# Patient Record
Sex: Male | Born: 1990 | Race: White | Hispanic: No | Marital: Single | Smoking: Current every day smoker
Health system: Southern US, Community
[De-identification: ages and names within clinical notes are randomized; demographics above are authoritative.]

## PROBLEM LIST (undated history)

## (undated) DIAGNOSIS — B1920 Unspecified viral hepatitis C without hepatic coma: Secondary | ICD-10-CM

## (undated) DIAGNOSIS — F909 Attention-deficit hyperactivity disorder, unspecified type: Secondary | ICD-10-CM

## (undated) DIAGNOSIS — F1110 Opioid abuse, uncomplicated: Secondary | ICD-10-CM

---
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
## 2012-11-06 ENCOUNTER — Encounter (HOSPITAL_COMMUNITY): Payer: Self-pay | Admitting: *Deleted

## 2012-11-06 ENCOUNTER — Emergency Department (HOSPITAL_COMMUNITY)
Admission: EM | Admit: 2012-11-06 | Discharge: 2012-11-07 | Discharge status: Against Medical Advice | Payer: Medicaid Other | Attending: Emergency Medicine | Admitting: Emergency Medicine

## 2012-11-06 DIAGNOSIS — Z79899 Other long term (current) drug therapy: Secondary | ICD-10-CM | POA: Insufficient documentation

## 2012-11-06 DIAGNOSIS — F1120 Opioid dependence, uncomplicated: Secondary | ICD-10-CM | POA: Insufficient documentation

## 2012-11-06 DIAGNOSIS — F17200 Nicotine dependence, unspecified, uncomplicated: Secondary | ICD-10-CM | POA: Insufficient documentation

## 2012-11-06 LAB — URINALYSIS, ROUTINE W REFLEX MICROSCOPIC
Bilirubin Urine: NEGATIVE
Ketones, ur: NEGATIVE mg/dL
Nitrite: NEGATIVE
pH: 6.5 (ref 5.0–8.0)

## 2012-11-06 LAB — COMPREHENSIVE METABOLIC PANEL
ALT: 15 U/L (ref 0–53)
AST: 19 U/L (ref 0–37)
Albumin: 3.9 g/dL (ref 3.5–5.2)
Calcium: 9.6 mg/dL (ref 8.4–10.5)
Sodium: 141 mEq/L (ref 135–145)
Total Protein: 7.6 g/dL (ref 6.0–8.3)

## 2012-11-06 LAB — RAPID URINE DRUG SCREEN, HOSP PERFORMED
Amphetamines: NOT DETECTED
Barbiturates: NOT DETECTED
Benzodiazepines: NOT DETECTED

## 2012-11-06 LAB — CBC WITH DIFFERENTIAL/PLATELET
Basophils Absolute: 0 10*3/uL (ref 0.0–0.1)
Eosinophils Absolute: 0.2 10*3/uL (ref 0.0–0.7)
Eosinophils Relative: 2 % (ref 0–5)
MCH: 30.8 pg (ref 26.0–34.0)
MCV: 87.4 fL (ref 78.0–100.0)
Platelets: 261 10*3/uL (ref 150–400)
RDW: 12.6 % (ref 11.5–15.5)
WBC: 7.2 10*3/uL (ref 4.0–10.5)

## 2012-11-06 LAB — ETHANOL: Alcohol, Ethyl (B): <11 mg/dL (ref 0–11)

## 2012-11-06 MED ORDER — LORAZEPAM 1 MG PO TABS: 1.0000 mg | ORAL_TABLET | Freq: Three times a day (TID) | ORAL | Status: DC | PRN

## 2012-11-06 MED ORDER — ONDANSETRON HCL 4 MG PO TABS: 4.0000 mg | ORAL_TABLET | Freq: Three times a day (TID) | ORAL | Status: DC | PRN

## 2012-11-06 MED ORDER — ALUM & MAG HYDROXIDE-SIMETH 200-200-20 MG/5ML PO SUSP: 30.0000 mL | ORAL | Status: DC | PRN

## 2012-11-06 MED ORDER — ACETAMINOPHEN 325 MG PO TABS: 650.0000 mg | ORAL_TABLET | ORAL | Status: DC | PRN

## 2012-11-06 MED ORDER — IBUPROFEN 600 MG PO TABS: 600.0000 mg | ORAL_TABLET | Freq: Three times a day (TID) | ORAL | Status: DC | PRN

## 2012-11-06 MED ORDER — ZOLPIDEM TARTRATE 5 MG PO TABS: 5.0000 mg | ORAL_TABLET | Freq: Every evening | ORAL | Status: DC | PRN

## 2012-11-06 NOTE — ED Provider Notes (Signed)
History     CSN: [id number]  Arrival date & time 11/06/12  2027   First MD Initiated Contact with Patient 11/06/12 2043      Chief Complaint  Patient presents with  . 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  Patient, states he does occasionally drink alcohol and smoke marijuana.  Patient denies suicidal or homicidal ideation.  Patient does not have any auditory or visual hallucinations.  Patient denies dizziness, chest pain, shortness of breath, visual changes, headache, nausea, vomiting, or diarrhea.  History reviewed. No pertinent past medical history.  History reviewed. No pertinent past surgical history.  History reviewed. No pertinent family history.  History  Substance Use Topics  . Smoking status: Current Every Day Smoker -- 1.0 packs/day    Types: Cigarettes  . Smokeless tobacco: Not on file  . Alcohol Use: Yes     Comment: "too upset to answer"      Review of Systems All other systems negative except as documented in the HPI. All pertinent positives and negatives as reviewed in the HPI.  Allergies  Cefzil  Home Medications   Current Outpatient Rx  Name  Route  Sig  Dispense  Refill  . DOXYCYCLINE HYCLATE 100 MG PO TABS   Oral   Take 100 mg by mouth daily.         Marland Kitchen LISDEXAMFETAMINE DIMESYLATE 70 MG PO CAPS   Oral   Take 70 mg by mouth daily.           BP 151/63  Pulse 91  Temp 99.1 F (37.3 C) (Oral)  Resp 21  SpO2 99%  Physical Exam  Nursing note and vitals reviewed. Constitutional: He is oriented to person, place, and time. He appears well-developed and well-nourished.  HENT:  Head: Normocephalic and atraumatic.  Mouth/Throat: Oropharynx is clear and  moist.  Eyes: Pupils are equal, round, and reactive to light.  Cardiovascular: Normal rate, regular rhythm and normal heart sounds.   Pulmonary/Chest: Effort normal and breath sounds normal. No respiratory distress.  Neurological: He is alert and oriented to person, place, and time.  Skin: Skin is warm and dry. No rash noted. No erythema. No pallor.  Psychiatric: He has a normal mood and affect. His speech is normal and behavior is normal. Judgment and thought content normal. Cognition and memory are normal.    ED Course  Procedures (including critical care time)  Labs Reviewed  URINE RAPID DRUG SCREEN (HOSP PERFORMED) - Abnormal; Notable for the following:    Opiates POSITIVE (*)     Tetrahydrocannabinol POSITIVE (*)     All other components within normal limits  CBC WITH DIFFERENTIAL  URINALYSIS, ROUTINE W REFLEX MICROSCOPIC  COMPREHENSIVE METABOLIC PANEL  ETHANOL   Patient will need evaluation by the ACT team   MDM          Carlyle Dolly, PA-C 11/06/12 2147

## 2012-11-06 NOTE — ED Notes (Signed)
Pt states he uses "drug"  Illegal and otherwise,  Also uses alcohol,  Pt request help to detox.  He is tearful during assesment

## 2012-11-06 NOTE — ED Provider Notes (Signed)
Medical screening examination/treatment/procedure(s) were performed by non-physician practitioner and as supervising physician I was immediately available for consultation/collaboration.   Akira Perusse, MD 11/06/12 2217 

## 2012-11-07 ENCOUNTER — Inpatient Hospital Stay (HOSPITAL_COMMUNITY): Admission: EM | Admit: 2012-11-07 | Payer: Medicaid Other | Source: Ambulatory Visit | Admitting: Psychiatry

## 2012-11-07 MED ORDER — CLONIDINE HCL 0.1 MG PO TABS: 0.1000 mg | ORAL_TABLET | Freq: Three times a day (TID) | ORAL | Status: DC | PRN

## 2012-11-07 NOTE — BH Assessment (Signed)
Assessment Note   [name] is a 21 y.o. 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  Pt denies SI/HI/Psych.  Pt says wants detox because--"I have a daughter, I don't want to live this way". Pt uses Heroin, 7-8 bags daily, last use was 11/06/12(used 4 bags today); THC, 2 grams daily, last used 11/06/12; Pain Pills(Oxycdodone, Percocet, Vicodin and Hydrcodone) 4-5 daily, says uses 2x's monthly, last use was 11/06/12.  Pt is intoxicated but is coherent enough to answer questions during assessment.  Pt has past inpt admissions with [facility] in 2011, 2009 and [place] (4 yrs ago) for SI/Depression.  Pt has current legal charges pending: Open Container, Felony Larceny and Failed Drug Test, tells this Clinical research associate that he is on probation for "Set up and deliver--Heroin".     Axis I: Polysub Dep  Axis II: Deferred Axis III: History reviewed. No pertinent past medical history. Axis IV: other psychosocial or environmental problems, problems related to legal system/crime, problems related to social environment and problems with primary support group Axis V: 51-60 moderate symptoms  Past Medical History: History reviewed. No pertinent past medical history.  History reviewed. No pertinent past surgical history.  Family History: History reviewed. No pertinent family history.  Social History:  reports that he has been smoking Cigarettes.  He has been smoking about 1 pack per day. He does not have any smokeless tobacco history on file. He reports that he drinks alcohol. He reports that he uses illicit drugs (Marijuana, Heroin, and Other-see comments).  Additional Social History:  Alcohol / Drug Use Pain Medications: None  Prescriptions: None  Over the Counter: None  History of alcohol / drug use?: Yes Substance #1 Name of Substance 1: Heroin--DOC 1 - Age of First Use: Teens  1 - Amount (size/oz): 7-8 Bags  1 - Frequency: Daily  1 - Duration: On-going  1 - Last  Use / Amount: 11/06/12 Substance #2 Name of Substance 2: THC  2 - Age of First Use: Teens  2 - Amount (size/oz): 2 Grams  2 - Frequency: Daily  2 - Duration: On-going  2 - Last Use / Amount: 11/06/12 Substance #3 Name of Substance 3: Pain Pills--Oxy, Percocet, Vicodin 3 - Age of First Use: Teens  3 - Amount (size/oz): 4-5 Pills  3 - Frequency: 2x's Monthly  3 - Duration: On-going  3 - Last Use / Amount: Unk  Substance #4 Name of Substance 4: Alcohol  4 - Age of First Use: Teens  4 - Amount (size/oz): 3-40's  4 - Frequency: Daily  4 - Duration: On-going  4 - Last Use / Amount: Unk   CIWA: CIWA-Ar BP: 149/65 mmHg Pulse Rate: 73  Nausea and Vomiting: no nausea and no vomiting Tactile Disturbances: none Tremor: no tremor Auditory Disturbances: not present Paroxysmal Sweats: no sweat visible Visual Disturbances: not present Anxiety: no anxiety, at ease Headache, Fullness in Head: none present Agitation: normal activity Orientation and Clouding of Sensorium: oriented and can do serial additions CIWA-Ar Total: 0  COWS: Clinical Opiate Withdrawal Scale (COWS) Resting Pulse Rate: Pulse Rate 80 or below Sweating: No report of chills or flushing Restlessness: Able to sit still Pupil Size: Pupils pinned or normal size for room light Bone or Joint Aches: Not present Runny Nose or Tearing: Not present GI Upset: No GI symptoms Tremor: No tremor Yawning: No yawning Anxiety or Irritability: None Gooseflesh Skin: Skin is smooth COWS Total Score: 0   Allergies:  Allergies  Allergen Reactions  . Cefzil (Cefprozil) Hives    Home Medications:  (Not in a hospital admission)  OB/GYN Status:  No LMP for male patient.  General Assessment Data [facility] of Assessment: [facility] Living Arrangements: Parent (Lives with mother ) Can pt return to current living arrangement?: Yes Admission Status: Voluntary Is patient capable of signing voluntary admission?: Yes Transfer from: Acute  Hospital Referral Source: MD  Education Status Is patient currently in school?: No Current Grade: None  Highest grade of school patient has completed: None  Name of school: None  Contact person: None   Risk to self Suicidal Ideation: No Suicidal Intent: No Is patient at risk for suicide?: No Suicidal Plan?: No Access to Means: No What has been your use of drugs/alcohol within the last 12 months?: Abusing: Heroin, Alcohol, THC, Pain Pills  Previous Attempts/Gestures: Yes (Thoughts only ) How many times?: 0  Other Self Harm Risks: None  Triggers for Past Attempts: Family contact Intentional Self Injurious Behavior: None Family Suicide History: No Recent stressful life event(s): Other (Comment) (Past hx SI thoughts; Chronic SA ) Persecutory voices/beliefs?: No Depression: Yes Depression Symptoms: Loss of interest in usual pleasures Substance abuse history and/or treatment for substance abuse?: Yes Suicide prevention information given to non-admitted patients: Not applicable  Risk to Others Homicidal Ideation: No Thoughts of Harm to Others: No Current Homicidal Intent: No Current Homicidal Plan: No Access to Homicidal Means: No Identified Victim: None  History of harm to others?: No Assessment of Violence: None Noted Violent Behavior Description: None  Does patient have access to weapons?: No Criminal Charges Pending?: Yes Describe Pending Criminal Charges: Open Container, Felony Larceny, Failed Drug test  Does patient have a court date: Yes Court Date:  (Unk )  Psychosis Hallucinations: None noted Delusions: None noted  Mental Status Report Appear/Hygiene: Disheveled Eye Contact: Good Motor Activity: Unremarkable Speech: Logical/coherent Level of Consciousness: Alert Mood: Other (Comment) (Appropriate ) Affect: Appropriate to circumstance Anxiety Level: None Thought Processes: Coherent;Relevant Judgement: Unimpaired Orientation:  Person;Place;Time;Situation Obsessive Compulsive Thoughts/Behaviors: None  Cognitive Functioning Concentration: Normal Memory: Recent Intact;Remote Intact IQ: Average Insight: Fair Impulse Control: Fair Appetite: Good Weight Loss: 0  Weight Gain: 0  Sleep: No Change Total Hours of Sleep: 8  Vegetative Symptoms: None  ADLScreening [facility]) Patient's cognitive ability adequate to safely complete daily activities?: Yes Patient able to express need for assistance with ADLs?: Yes Independently performs ADLs?: Yes (appropriate for developmental age)  Abuse/Neglect [facility]) Physical Abuse: Denies Verbal Abuse: Denies Sexual Abuse: Denies  Prior Inpatient Therapy Prior Inpatient Therapy: Yes Prior Therapy Dates: 2011, 2009 Prior Therapy Facilty/Provider(s): [facility]  Reason for Treatment: SI/Depression   Prior Outpatient Therapy Prior Outpatient Therapy: No Prior Therapy Dates: None  Prior Therapy Facilty/Provider(s): None  Reason for Treatment: None   ADL Screening (condition at time of admission) Patient's cognitive ability adequate to safely complete daily activities?: Yes Patient able to express need for assistance with ADLs?: Yes Independently performs ADLs?: Yes (appropriate for developmental age) Weakness of Legs: None Weakness of Arms/Hands: None  Home Assistive Devices/Equipment Home Assistive Devices/Equipment: None  Therapy Consults (therapy consults require a physician order) PT Evaluation Needed: No OT Evalulation Needed: No SLP Evaluation Needed: No Abuse/Neglect Assessment (Assessment to be complete while patient is alone) Physical Abuse: Denies Verbal Abuse: Denies Sexual Abuse: Denies Exploitation of patient/patient's resources: Denies Self-Neglect: Denies Values / Beliefs Cultural Requests During Hospitalization: None Spiritual Requests During Hospitalization: None Consults Spiritual Care Consult Needed: No  Social Work Consult Needed:  No Merchant navy officer (For Healthcare) Advance Directive: Patient does not have advance directive;Patient would not like information Pre-existing out of facility DNR order (yellow form or pink MOST form): No Nutrition Screen- [facility] Adult/[facility] Patient's home diet: Regular Have you recently lost weight without trying?: No Have you been eating poorly because of a decreased appetite?: No Malnutrition Screening Tool Score: 0   Additional Information 1:1 In Past 12 Months?: No CIRT Risk: No Elopement Risk: No Does patient have medical clearance?: Yes     Disposition:  Disposition Disposition of Patient: Inpatient treatment program;Referred to [facility] ) Type of inpatient treatment program: Adult Patient referred to: Other (Comment) [facility] )  On Site Evaluation by:   Reviewed with Physician:     Murrell Redden 11/07/2012 12:17 AM

## 2012-11-07 NOTE — ED Notes (Addendum)
Pt requested to leave AMA. Psych MD and Charge RN aware.

## 2012-11-07 NOTE — ED Provider Notes (Signed)
[name] is a 21 y.o. male who is here for withdrawal from opiates. He uses heroin and illicit narcotic tablets. Today, he "feels like, butt", he, states that this is a sensation of withdrawal from opiates.  Will add clonidine to his medications.  He has been accepted to the behavioral health hospital pending an open bed.  Flint Melter, MD 11/07/12 [phone]

## 2012-11-07 NOTE — BHH Counselor (Signed)
Pt accepted to [facility] by Donell Sievert, PA to Dr. Dub Mikes. Pending bed availability.

## 2013-02-09 ENCOUNTER — Encounter (HOSPITAL_BASED_OUTPATIENT_CLINIC_OR_DEPARTMENT_OTHER): Payer: Self-pay

## 2013-02-09 ENCOUNTER — Emergency Department (HOSPITAL_BASED_OUTPATIENT_CLINIC_OR_DEPARTMENT_OTHER)
Admission: EM | Admit: 2013-02-09 | Discharge: 2013-02-09 | Disposition: A | Discharge status: Home / Self Care | Payer: Medicaid Other | Attending: Emergency Medicine | Admitting: Emergency Medicine

## 2013-02-09 DIAGNOSIS — F1110 Opioid abuse, uncomplicated: Secondary | ICD-10-CM | POA: Insufficient documentation

## 2013-02-09 DIAGNOSIS — F17200 Nicotine dependence, unspecified, uncomplicated: Secondary | ICD-10-CM | POA: Insufficient documentation

## 2013-02-09 DIAGNOSIS — F1410 Cocaine abuse, uncomplicated: Secondary | ICD-10-CM | POA: Insufficient documentation

## 2013-02-09 DIAGNOSIS — B86 Scabies: Secondary | ICD-10-CM | POA: Insufficient documentation

## 2013-02-09 DIAGNOSIS — Z79899 Other long term (current) drug therapy: Secondary | ICD-10-CM | POA: Insufficient documentation

## 2013-02-09 HISTORY — DX: Opioid abuse, uncomplicated: F11.10

## 2013-02-09 MED ORDER — PERMETHRIN 5 % EX CREA: TOPICAL_CREAM | Freq: Once | CUTANEOUS | Status: DC

## 2013-02-09 NOTE — ED Provider Notes (Signed)
History     CSN: [id number]  Arrival date & time 02/09/13  0745   First MD Initiated Contact with Patient 02/09/13 [phone]      Chief Complaint  Patient presents with  . Rash    (Consider location/radiation/quality/duration/timing/severity/associated sxs/prior treatment) HPI Comments: 22 y.o PMH substance abuse (clean heroin 2 weeks, opiates, THC).  He presents after rash starting 12/20/12 while he was in a rehab facility in [place] at time of onset.  Rash is itching mostly at night.  Lives at home with mother (she is not itching).  He has a dog at home (pit bull) which is not itching (denies fleas being on the dog).  Nothing makes better tried Benadryl for itching which did not help.    SH: 1ppd, THC. Sexually active with intermittent use of condoms   Patient is a 22 y.o. male presenting with rash. The history is provided by the patient. No language interpreter was used.  Rash Location: thighs, hands, trunk. Quality: itchiness   Onset quality:  Gradual Duration:  1 month Progression:  Worsening Context: animal contact   Relieved by:  Antihistamines Worsened by:  Nothing tried Ineffective treatments:  Antihistamines Associated symptoms: no abdominal pain, no fever, no nausea, no shortness of breath and not vomiting     Past Medical History  Diagnosis Date  . Heroin abuse     History reviewed. No pertinent past surgical history.  History reviewed. No pertinent family history.  History  Substance Use Topics  . Smoking status: Current Every Day Smoker -- 1.00 packs/day for 5 years    Types: Cigarettes  . Smokeless tobacco: Never Used  . Alcohol Use: Yes     Comment: occasional use      Review of Systems  Constitutional: Negative for fever and chills.  Respiratory: Negative for shortness of breath.   Cardiovascular: Negative for chest pain.  Gastrointestinal: Negative for nausea, vomiting and abdominal pain.  Skin: Positive for rash.  All other systems reviewed  and are negative.    Allergies  Cefzil  Home Medications   Current Outpatient Rx  Name  Route  Sig  Dispense  Refill  . doxycycline (VIBRA-TABS) 100 MG tablet   Oral   Take 100 mg by mouth daily.         Marland Kitchen lisdexamfetamine (VYVANSE) 70 MG capsule   Oral   Take 70 mg by mouth daily.         . permethrin (ELIMITE) 5 % cream   Topical   Apply topically once.   60 g   1     BP 143/68  Pulse 70  Temp(Src) 97.8 F (36.6 C) (Oral)  Resp 17  Ht 6' (1.829 m)  Wt 165 lb (74.844 kg)  BMI 22.37 kg/m2  SpO2 99%  Physical Exam  Nursing note and vitals reviewed. Constitutional: He is oriented to person, place, and time. Vital signs are normal. He appears well-developed and well-nourished. He is cooperative. No distress.  HENT:  Head: Normocephalic and atraumatic.  Mouth/Throat: No oropharyngeal exudate.  Eyes: Conjunctivae are normal. Right eye exhibits no discharge. Left eye exhibits no discharge. No scleral icterus.  Cardiovascular: Normal rate, regular rhythm, S1 normal, S2 normal and normal heart sounds.   No murmur heard. Pulmonary/Chest: Effort normal and breath sounds normal. He has no wheezes.  Abdominal: Soft. Bowel sounds are normal. He exhibits no distension. There is no tenderness.  Musculoskeletal: He exhibits no edema.  Neurological: He is alert and oriented to person,  place, and time. Gait normal.  Skin: Skin is warm and dry. Rash noted. He is not diaphoretic.  Erythematous pruritic papules to finger webs, b/l legs (knees), trunk  Psychiatric: He has a normal mood and affect. His speech is normal and behavior is normal. Judgment and thought content normal. Cognition and memory are normal.    ED Course  Procedures (including critical care time)  Labs Reviewed - No data to display No results found.   1. Scabies       MDM  Elimite 5% once.  Repeat  X 1 if still itching.  Desma Maxim MD [phone]         Annett Gula, MD 02/09/13 [phone]

## 2013-02-09 NOTE — ED Provider Notes (Signed)
I saw and evaluated the patient, reviewed the resident's note and I agree with the findings and plan.  The patient presents with itchy rash for the past three weeks.  This started while in a rehab facility.  Otherwise no new contacts or exposures.  No fevers or chills.    On exam, the vitals are stable and the patient is afebrile.  There is a patchy, macular, pruritic rash to the hands, arms, and torso.  It is most pronounced in the webbing between the fingers.    This appears to be scabies and will be treated as such with permethrin.  Return or follow up prn.   Geoffery Lyons, MD 02/09/13 [phone]

## 2013-02-09 NOTE — ED Notes (Signed)
Pt states that he has had a rash generalized over body since January.  Pt states that this presented when he was at [facility] for substance abuse treatment.  Pt is now at [facility] for substance abuse tx.  Rash is small red rash, generalized over torso, extremities.

## 2013-02-10 ENCOUNTER — Encounter (HOSPITAL_BASED_OUTPATIENT_CLINIC_OR_DEPARTMENT_OTHER): Payer: Self-pay

## 2013-02-10 ENCOUNTER — Emergency Department (HOSPITAL_BASED_OUTPATIENT_CLINIC_OR_DEPARTMENT_OTHER)
Admission: EM | Admit: 2013-02-10 | Discharge: 2013-02-10 | Disposition: A | Discharge status: Home / Self Care | Payer: Medicaid Other | Attending: Emergency Medicine | Admitting: Emergency Medicine

## 2013-02-10 DIAGNOSIS — B86 Scabies: Secondary | ICD-10-CM | POA: Insufficient documentation

## 2013-02-10 DIAGNOSIS — F17200 Nicotine dependence, unspecified, uncomplicated: Secondary | ICD-10-CM | POA: Insufficient documentation

## 2013-02-10 DIAGNOSIS — F1210 Cannabis abuse, uncomplicated: Secondary | ICD-10-CM | POA: Insufficient documentation

## 2013-02-10 DIAGNOSIS — F1110 Opioid abuse, uncomplicated: Secondary | ICD-10-CM | POA: Insufficient documentation

## 2013-02-10 MED ORDER — PERMETHRIN 5 % EX CREA: TOPICAL_CREAM | CUTANEOUS | Status: DC

## 2013-02-10 NOTE — ED Notes (Signed)
Pt has had rash generalized over body since January 2nd, Pt was seen here on 2/22 and dx with scabies, pt is resident at [facility] and was told by staff there to use all the cream for the treatment at one time and now they want him to come back to ER for more treatment.  Rash improving some since 2/22 when seen by this RN.

## 2013-02-10 NOTE — ED Provider Notes (Signed)
History/physical exam/procedure(s) were performed by non-physician practitioner and as supervising physician I was immediately available for consultation/collaboration. I have reviewed all notes and am in agreement with care and plan.   Hilario Quarry, MD 02/10/13 [phone]

## 2013-02-10 NOTE — ED Provider Notes (Signed)
History     CSN: [id number]  Arrival date & time 02/10/13  1601   First MD Initiated Contact with Patient 02/10/13 1621      Chief Complaint  Patient presents with  . Scabies     (Consider location/radiation/quality/duration/timing/severity/associated sxs/prior treatment) HPI Comments: Patient is a 22 year old male who presents with a 2 month history of rash. The rash started gradually and progressively worsened since the onset. The rash is located on generalized body. Patient has tried Permethrin that was prescribed yesterday which has provided some relief. Patient denies new exposures to medications, soaps, lotions, detergent. Patient reports associated occasional itching. No aggravating/alleviating factors. Patient denies fever, chills, NVD, sore throat, oral lesions, ocular involvement, throat closing, wheezing, SOB, chest pain, abdominal pain. Patient is at [facility] and they want the patient to have more Permethrin.      Past Medical History  Diagnosis Date  . Heroin abuse     History reviewed. No pertinent past surgical history.  History reviewed. No pertinent family history.  History  Substance Use Topics  . Smoking status: Current Every Day Smoker -- 1.00 packs/day for 5 years    Types: Cigarettes  . Smokeless tobacco: Never Used  . Alcohol Use: Yes     Comment: occasional use      Review of Systems  Skin: Positive for rash.  All other systems reviewed and are negative.    Allergies  Cefzil  Home Medications   Current Outpatient Rx  Name  Route  Sig  Dispense  Refill  . doxycycline (VIBRA-TABS) 100 MG tablet   Oral   Take 100 mg by mouth daily.         Marland Kitchen lisdexamfetamine (VYVANSE) 70 MG capsule   Oral   Take 70 mg by mouth daily.         . permethrin (ELIMITE) 5 % cream   Topical   Apply topically once.   60 g   1     BP 139/82  Pulse 90  Temp(Src) 98.2 F (36.8 C) (Oral)  Resp 18  Ht 6' (1.829 m)  Wt 165 lb (74.844 kg)  BMI 22.37  kg/m2  SpO2 99%  Physical Exam  Nursing note and vitals reviewed. Constitutional: He appears well-developed and well-nourished. No distress.  HENT:  Head: Normocephalic and atraumatic.  Eyes: Conjunctivae are normal.  Neck: Normal range of motion.  Cardiovascular: Normal rate and regular rhythm.  Exam reveals no gallop and no friction rub.   No murmur heard. Pulmonary/Chest: Effort normal and breath sounds normal. He has no wheezes. He has no rales. He exhibits no tenderness.  Abdominal: Soft. There is no tenderness.  Musculoskeletal: Normal range of motion.  Neurological: He is alert.  Speech is goal-oriented. Moves limbs without ataxia.   Skin: Skin is warm and dry.  Scattered papules over generalized body and finger webs.   Psychiatric: He has a normal mood and affect. His behavior is normal.    ED Course  Procedures (including critical care time)  Labs Reviewed - No data to display No results found.   1. Scabies       MDM  4:35 PM Patient will have another prescription for Permethrin per [facility] request. No further evaluation needed at this time.       Emilia Beck, [place] 02/10/13 1639

## 2013-02-20 NOTE — ED Notes (Signed)
Pt came to lobby requesting copy of dc papers from previous visit.Marland KitchenMarland KitchenAn After Visit Summary was printed and given to the patient. Provided.

## 2013-04-11 ENCOUNTER — Other Ambulatory Visit: Payer: Self-pay

## 2013-04-11 ENCOUNTER — Encounter (HOSPITAL_COMMUNITY): Payer: Self-pay | Admitting: Emergency Medicine

## 2013-04-11 ENCOUNTER — Inpatient Hospital Stay (HOSPITAL_COMMUNITY)
Admission: EM | Admit: 2013-04-11 | Discharge: 2013-04-12 | DRG: 918 | Disposition: A | Discharge status: Home / Self Care | Payer: Medicaid Other | Attending: Family Medicine | Admitting: Family Medicine

## 2013-04-11 DIAGNOSIS — F1210 Cannabis abuse, uncomplicated: Secondary | ICD-10-CM | POA: Diagnosis present

## 2013-04-11 DIAGNOSIS — F1110 Opioid abuse, uncomplicated: Secondary | ICD-10-CM | POA: Diagnosis present

## 2013-04-11 DIAGNOSIS — D72829 Elevated white blood cell count, unspecified: Secondary | ICD-10-CM | POA: Diagnosis present

## 2013-04-11 DIAGNOSIS — I498 Other specified cardiac arrhythmias: Secondary | ICD-10-CM | POA: Diagnosis present

## 2013-04-11 DIAGNOSIS — T401X4A Poisoning by heroin, undetermined, initial encounter: Principal | ICD-10-CM | POA: Diagnosis present

## 2013-04-11 DIAGNOSIS — F17200 Nicotine dependence, unspecified, uncomplicated: Secondary | ICD-10-CM | POA: Diagnosis present

## 2013-04-11 DIAGNOSIS — B1920 Unspecified viral hepatitis C without hepatic coma: Secondary | ICD-10-CM | POA: Diagnosis present

## 2013-04-11 DIAGNOSIS — T401X1A Poisoning by heroin, accidental (unintentional), initial encounter: Secondary | ICD-10-CM

## 2013-04-11 DIAGNOSIS — R768 Other specified abnormal immunological findings in serum: Secondary | ICD-10-CM | POA: Diagnosis present

## 2013-04-11 DIAGNOSIS — T50901A Poisoning by unspecified drugs, medicaments and biological substances, accidental (unintentional), initial encounter: Secondary | ICD-10-CM

## 2013-04-11 HISTORY — DX: Attention-deficit hyperactivity disorder, unspecified type: F90.9

## 2013-04-11 LAB — COMPREHENSIVE METABOLIC PANEL
Albumin: 3.9 g/dL (ref 3.5–5.2)
BUN: 17 mg/dL (ref 6–23)
Calcium: 8.9 mg/dL (ref 8.4–10.5)
Chloride: 101 mEq/L (ref 96–112)
Creatinine, Ser: 1.12 mg/dL (ref 0.50–1.35)
Total Bilirubin: 0.3 mg/dL (ref 0.3–1.2)

## 2013-04-11 LAB — CBC
HCT: 42.6 % (ref 39.0–52.0)
MCH: 30.8 pg (ref 26.0–34.0)
MCHC: 35.2 g/dL (ref 30.0–36.0)
MCV: 87.5 fL (ref 78.0–100.0)
Platelets: 286 10*3/uL (ref 150–400)
RDW: 12.9 % (ref 11.5–15.5)
WBC: 19.5 10*3/uL — ABNORMAL HIGH (ref 4.0–10.5)

## 2013-04-11 LAB — SALICYLATE LEVEL: Salicylate Lvl: <2 mg/dL — ABNORMAL LOW (ref 2.8–20.0)

## 2013-04-11 LAB — RAPID URINE DRUG SCREEN, HOSP PERFORMED
Barbiturates: NOT DETECTED
Cocaine: NOT DETECTED

## 2013-04-11 LAB — GLUCOSE, CAPILLARY: Glucose-Capillary: 139 mg/dL — ABNORMAL HIGH (ref 70–99)

## 2013-04-11 LAB — ACETAMINOPHEN LEVEL: Acetaminophen (Tylenol), Serum: <15 ug/mL (ref 10–30)

## 2013-04-11 LAB — ETHANOL: Alcohol, Ethyl (B): <11 mg/dL (ref 0–11)

## 2013-04-11 MED ORDER — SODIUM CHLORIDE 0.9 % IV SOLN
INTRAVENOUS | Status: AC
  Administered 2013-04-12: 02:00:00 via INTRAVENOUS

## 2013-04-11 MED ORDER — SODIUM CHLORIDE 0.9 % IV BOLUS (SEPSIS)
1000.0000 mL | Freq: Once | INTRAVENOUS | Status: AC
  Administered 2013-04-11: 1000 mL via INTRAVENOUS

## 2013-04-11 NOTE — ED Provider Notes (Signed)
History     CSN: [id number]  Arrival date & time 04/11/13  1805   First MD Initiated Contact with Patient 04/11/13 1826      Chief Complaint  Patient presents with  . Drug Overdose    Heroin    (Consider location/radiation/quality/duration/timing/severity/associated sxs/prior treatment) HPI Pt brought in by EMS after being found unresponsive and apneic outdoors. Per EMS pt was cyanotic at scene. Given 1 mg of Narcan with return to normal levels of alertness and supporting airway. Pt admits to "shooting up" heroin but says that he rarely uses. Denied other drugs or alcohol. Denied SI. Pt denies HA, neck pain, CP, SOB abd pain, N/V/D. No trauma.  Past Medical History  Diagnosis Date  . Heroin abuse     History reviewed. No pertinent past surgical history.  No family history on file.  History  Substance Use Topics  . Smoking status: Current Every Day Smoker -- 1.00 packs/day for 5 years    Types: Cigarettes  . Smokeless tobacco: Never Used  . Alcohol Use: Yes     Comment: occasional use      Review of Systems  Constitutional: Negative for fever and chills.  HENT: Negative for facial swelling and neck pain.   Respiratory: Negative for shortness of breath.   Cardiovascular: Negative for chest pain.  Gastrointestinal: Negative for nausea, vomiting, abdominal pain and diarrhea.  Musculoskeletal: Negative for myalgias and back pain.  Skin: Negative for rash and wound.  Neurological: Positive for syncope. Negative for dizziness, weakness, light-headedness, numbness and headaches.  Psychiatric/Behavioral: Negative for suicidal ideas.  All other systems reviewed and are negative.    Allergies  Cefzil  Home Medications   Current Outpatient Rx  Name  Route  Sig  Dispense  Refill  . doxycycline (VIBRA-TABS) 100 MG tablet   Oral   Take 100 mg by mouth daily.         Marland Kitchen lisdexamfetamine (VYVANSE) 70 MG capsule   Oral   Take 70 mg by mouth daily.           BP  118/57  Pulse 78  Temp(Src) 98 F (36.7 C) (Oral)  Resp 17  SpO2 100%  Physical Exam  Nursing note and vitals reviewed. Constitutional: He is oriented to person, place, and time. He appears well-developed and well-nourished. No distress.  HENT:  Head: Normocephalic and atraumatic.  Mouth/Throat: Oropharynx is clear and moist.  Dried lips  Eyes: EOM are normal. Pupils are equal, round, and reactive to light.  bl pinpoint pupils  Neck: Normal range of motion. Neck supple.  No posterior cervical tenderness or evidence of trauma  Cardiovascular: Regular rhythm.   tachycardia  Pulmonary/Chest: Effort normal and breath sounds normal. No respiratory distress. He has no wheezes. He has no rales. He exhibits no tenderness.  Abdominal: Soft. Bowel sounds are normal. He exhibits no distension and no mass. There is no tenderness. There is no rebound and no guarding.  Musculoskeletal: Normal range of motion. He exhibits no edema and no tenderness.  Neurological: He is alert and oriented to person, place, and time.  5/5 motor in all ext, sensation intact  Skin: Skin is warm and dry. No rash noted. No erythema.  Psychiatric: He has a normal mood and affect. His behavior is normal.    ED Course  Procedures (including critical care time)  Labs Reviewed  CBC - Abnormal; Notable for the following:    WBC 19.5 (*)    All other components within normal  limits  COMPREHENSIVE METABOLIC PANEL - Abnormal; Notable for the following:    Glucose, Bld 117 (*)    AST 106 (*)    ALT 269 (*)    All other components within normal limits  SALICYLATE LEVEL - Abnormal; Notable for the following:    Salicylate Lvl <2.0 (*)    All other components within normal limits  GLUCOSE, CAPILLARY - Abnormal; Notable for the following:    Glucose-Capillary 139 (*)    All other components within normal limits  ETHANOL  ACETAMINOPHEN LEVEL  TROPONIN I  URINE RAPID DRUG SCREEN (HOSP PERFORMED)   No results  found.   1. Drug overdose, initial encounter      Date: 04/11/2013  Rate:105  Rhythm: sinus tachycardia  QRS Axis: normal  Intervals: normal  ST/T Wave abnormalities: ST depressions inferiorly  Conduction Disutrbances:nonspecific intraventricular conduction delay  Narrative Interpretation:   Old EKG Reviewed: none available    MDM   [facility] to admit for obs. Cardiology reviewed EKG. Suggest repeat in AM. No immediate intervention needed.        Loren Racer, MD 04/11/13 [phone]

## 2013-04-11 NOTE — ED Notes (Signed)
Apple juice and ham sandwich give to pt

## 2013-04-11 NOTE — ED Notes (Signed)
Per EMS pt was found in a car behind cookout on [address] with his friend where they both OD on heroin.  A bystander found them both on responsive.  Per EMS pt was apneic when they arrived on scene and pt was cyanotic.  EMS admin 1mg  Narcan at scene as well as had to assisted with respirations.  Pt now A&Ox4, Pt has 20g in left forearm, CBG 219, 128/84, pt showing sius tach per EMS HR112, 97% 2L O2.

## 2013-04-11 NOTE — ED Notes (Signed)
[id number]<AV> Expected date:<BR> Expected time:<BR> Means of arrival:Ambulance<BR> Comments:<BR> Heroin OD

## 2013-04-11 NOTE — ED Notes (Signed)
Pt has attempted to urinate x's 3. Pt has had 2 pitchers of water and 1.5L of fluid. Dr. Ranae Palms aware.

## 2013-04-11 NOTE — ED Notes (Signed)
Pt unable to urinate.

## 2013-04-11 NOTE — H&P (Addendum)
[facility] History and Physical  [name]:[id number] DOB: 29-Oct-1991 DOA: 04/11/2013  Referring physician: ED physician PCP: Kaleen Mask, MD   Chief Complaint: heroin overdose  HPI:  22 year old male with no significant  past medical history who was brought by EMS to [facility] ED 04/11/2013 after he was found unresponsive in a car after heroin use. In field, patient was given narcan 1 dose and became more alert. Patient reports no chest pain, no shortness of breath, no palpitations. No abdominal pain, no nausea or vomiting. No diarrhea or constipation. No fever or chills. No blood in stool or urine. In ED, patient was more alert, BP 118/57 and HR 76 - 121. EKG 12 lead showed sinus tachycardia. His CBC revealed WBC count of 19.5.  Assessment and Plan:  Principal Problem:   Heroin overdose - patient more alert after narcan - monitor on telemetry - provide supportive care, IV fluids, antiemetics, analgesics if needed Active Problems:   Leukocytosis - unclear etiology; likely reactive - follow up urinalysis results.   Transaminitis - unclear etiology  - will check hepatitis panel  Code Status: Full Family Communication: no family at the bedside Disposition Plan: admit to telemetry  [facility] B[facility] [phone]  Review of Systems:  Constitutional: Negative for fever, chills and malaise/fatigue. Negative for diaphoresis.  HENT: Negative for hearing loss, ear pain, nosebleeds, congestion, sore throat, neck pain, tinnitus and ear discharge.   Eyes: Negative for blurred vision, double vision, photophobia, pain, discharge and redness.  Respiratory: Negative for cough, hemoptysis, sputum production, shortness of breath, wheezing and stridor.   Cardiovascular: Negative for chest pain, palpitations, orthopnea, claudication and leg swelling.  Gastrointestinal: Negative for nausea, vomiting and abdominal pain. Negative for heartburn, constipation, blood in stool and  melena.  Genitourinary: Negative for dysuria, urgency, frequency, hematuria and flank pain.  Musculoskeletal: Negative for myalgias, back pain, joint pain and falls.  Skin: Negative for itching and rash.  Neurological: Negative for dizziness and weakness. Negative for tingling, tremors, sensory change, speech change, focal weakness, loss of consciousness and headaches.  Endo/Heme/Allergies: Negative for environmental allergies and polydipsia. Does not bruise/bleed easily.  Psychiatric/Behavioral: Negative for suicidal ideas. The patient is not nervous/anxious.      Past Medical History  Diagnosis Date  . Heroin abuse     History reviewed. No pertinent past surgical history.  Social History:  reports that he has been smoking Cigarettes.  He has a 5 pack-year smoking history. He has never used smokeless tobacco. He reports that  drinks alcohol. He reports that he uses illicit drugs (Marijuana, Heroin, and Other-see comments).  Allergies  Allergen Reactions  . Cefzil (Cefprozil) Hives    Family history: htn in mother  Prior to Admission medications   Medication Sig Start Date End Date Taking? Authorizing Provider  doxycycline (VIBRA-TABS) 100 MG tablet Take 100 mg by mouth daily.   Yes Historical Provider, MD  lisdexamfetamine (VYVANSE) 70 MG capsule Take 70 mg by mouth daily.   Yes Historical Provider, MD    Physical Exam: Filed Vitals:   04/11/13 2030 04/11/13 2200 04/11/13 2230 04/11/13 2250  BP: 134/86 119/76 118/57 118/57  Pulse:  78 76 78  Temp:      TempSrc:      Resp:  15 12 17   SpO2:  100% 100% 100%    Physical Exam  Constitutional: Appears well-developed and well-nourished. No distress.  HENT: Normocephalic. External right and left ear normal. Oropharynx is clear and moist.  Eyes:  Conjunctivae and EOM are normal. PERRLA, no scleral icterus.  Neck: Normal ROM. Neck supple. No JVD. No tracheal deviation. No thyromegaly.  CVS: regular rhythm, tachycardic, no murmur   Pulmonary: Effort and breath sounds normal, no stridor, rhonchi, wheezes, rales.  Abdominal: Soft. BS +,  no distension, tenderness, rebound or guarding.  Musculoskeletal: Normal range of motion. No edema and no tenderness.  Lymphadenopathy: No lymphadenopathy noted, cervical, inguinal. Neuro: Alert. Normal reflexes, muscle tone coordination. No cranial nerve deficit. Skin: Skin is warm and dry. No rash noted. Not diaphoretic. No erythema. No pallor.  Psychiatric: Normal mood and affect. Behavior, judgment, thought content normal.   Labs on Admission:  Basic Metabolic Panel:  Recent Labs Lab 04/11/13 1902  NA 139  K 4.3  CL 101  CO2 27  GLUCOSE 117*  BUN 17  CREATININE 1.12  CALCIUM 8.9   Liver Function Tests:  Recent Labs Lab 04/11/13 1902  AST 106*  ALT 269*  ALKPHOS 72  BILITOT 0.3  PROT 7.6  ALBUMIN 3.9   CBC:  Recent Labs Lab 04/11/13 1902  WBC 19.5*  HGB 15.0  HCT 42.6  MCV 87.5  PLT 286   Cardiac Enzymes:  Recent Labs Lab 04/11/13 1901  TROPONINI <0.30  CBG:  Recent Labs Lab 04/11/13 1832  GLUCAP 139*    Radiological Exams on Admission: No results found.  EKG: sinus tachycardia  Debbora Presto, MD  [facility] Pager [phone]  If 7PM-7AM, please contact night-coverage [email or web address] Password [facility] 04/11/2013, 11:31 PM

## 2013-04-11 NOTE — ED Notes (Signed)
Pt in BR 

## 2013-04-11 NOTE — ED Notes (Signed)
Pt was placed on 2L O2 via Gisela

## 2013-04-12 ENCOUNTER — Encounter (HOSPITAL_COMMUNITY): Payer: Self-pay | Admitting: *Deleted

## 2013-04-12 DIAGNOSIS — Z5189 Encounter for other specified aftercare: Secondary | ICD-10-CM

## 2013-04-12 DIAGNOSIS — R768 Other specified abnormal immunological findings in serum: Secondary | ICD-10-CM | POA: Diagnosis present

## 2013-04-12 LAB — CBC
HCT: 37.1 % — ABNORMAL LOW (ref 39.0–52.0)
HCT: 36.7 % — ABNORMAL LOW (ref 39.0–52.0)
Hemoglobin: 13.1 g/dL (ref 13.0–17.0)
Hemoglobin: 12.6 g/dL — ABNORMAL LOW (ref 13.0–17.0)
MCH: 30.4 pg (ref 26.0–34.0)
MCHC: 35.3 g/dL (ref 30.0–36.0)
MCHC: 34.3 g/dL (ref 30.0–36.0)
MCV: 87.3 fL (ref 78.0–100.0)
MCV: 88.4 fL (ref 78.0–100.0)
RBC: 4.25 MIL/uL (ref 4.22–5.81)
RBC: 4.15 MIL/uL — ABNORMAL LOW (ref 4.22–5.81)
RDW: 13.1 % (ref 11.5–15.5)
RDW: 13.3 % (ref 11.5–15.5)
WBC: 9.4 10*3/uL (ref 4.0–10.5)

## 2013-04-12 LAB — URINALYSIS, ROUTINE W REFLEX MICROSCOPIC
Bilirubin Urine: NEGATIVE
Ketones, ur: NEGATIVE mg/dL
Leukocytes, UA: NEGATIVE
Nitrite: NEGATIVE
Specific Gravity, Urine: 1.01 (ref 1.005–1.030)
Urobilinogen, UA: 0.2 mg/dL (ref 0.0–1.0)
pH: 6 (ref 5.0–8.0)

## 2013-04-12 LAB — HEPATITIS PANEL, ACUTE
HCV Ab: REACTIVE — AB
Hep A IgM: NEGATIVE
Hep B C IgM: NEGATIVE
Hepatitis B Surface Ag: NEGATIVE

## 2013-04-12 LAB — COMPREHENSIVE METABOLIC PANEL
ALT: 212 U/L — ABNORMAL HIGH (ref 0–53)
AST: 83 U/L — ABNORMAL HIGH (ref 0–37)
AST: 72 U/L — ABNORMAL HIGH (ref 0–37)
Albumin: 3.4 g/dL — ABNORMAL LOW (ref 3.5–5.2)
Albumin: 3 g/dL — ABNORMAL LOW (ref 3.5–5.2)
Alkaline Phosphatase: 58 U/L (ref 39–117)
Alkaline Phosphatase: 55 U/L (ref 39–117)
BUN: 11 mg/dL (ref 6–23)
Calcium: 8.6 mg/dL (ref 8.4–10.5)
Creatinine, Ser: 0.83 mg/dL (ref 0.50–1.35)
GFR calc Af Amer: >90 mL/min (ref >90)
Glucose, Bld: 101 mg/dL — ABNORMAL HIGH (ref 70–99)
Potassium: 3.6 mEq/L (ref 3.5–5.1)
Potassium: 3.7 mEq/L (ref 3.5–5.1)
Sodium: 135 mEq/L (ref 135–145)
Total Protein: 6.4 g/dL (ref 6.0–8.3)
Total Protein: 6 g/dL (ref 6.0–8.3)

## 2013-04-12 LAB — GLUCOSE, CAPILLARY

## 2013-04-12 MED ORDER — ACETAMINOPHEN 325 MG PO TABS: 650.0000 mg | ORAL_TABLET | Freq: Four times a day (QID) | ORAL | Status: DC | PRN

## 2013-04-12 MED ORDER — PNEUMOCOCCAL VAC POLYVALENT 25 MCG/0.5ML IJ INJ: 0.5000 mL | INJECTION | INTRAMUSCULAR | Status: DC

## 2013-04-12 MED ORDER — HYDROCODONE-ACETAMINOPHEN 5-325 MG PO TABS: 1.0000 | ORAL_TABLET | ORAL | Status: DC | PRN

## 2013-04-12 MED ORDER — ONDANSETRON HCL 4 MG/2ML IJ SOLN: 4.0000 mg | Freq: Four times a day (QID) | INTRAMUSCULAR | Status: DC | PRN

## 2013-04-12 MED ORDER — MORPHINE SULFATE 2 MG/ML IJ SOLN: 1.0000 mg | INTRAMUSCULAR | Status: DC | PRN

## 2013-04-12 MED ORDER — ACETAMINOPHEN 650 MG RE SUPP: 650.0000 mg | Freq: Four times a day (QID) | RECTAL | Status: DC | PRN

## 2013-04-12 MED ORDER — ONDANSETRON HCL 4 MG PO TABS: 4.0000 mg | ORAL_TABLET | Freq: Four times a day (QID) | ORAL | Status: DC | PRN

## 2013-04-12 MED ORDER — ENOXAPARIN SODIUM 40 MG/0.4ML ~~LOC~~ SOLN
40.0000 mg | SUBCUTANEOUS | Status: DC
  Administered 2013-04-12: 40 mg via SUBCUTANEOUS
  Filled 2013-04-12: qty 0.4

## 2013-04-12 MED ORDER — SODIUM CHLORIDE 0.9 % IJ SOLN
3.0000 mL | Freq: Two times a day (BID) | INTRAMUSCULAR | Status: DC
  Administered 2013-04-12 (×2): 3 mL via INTRAVENOUS

## 2013-04-12 MED ORDER — ENOXAPARIN SODIUM 30 MG/0.3ML ~~LOC~~ SOLN: 30.0000 mg | SUBCUTANEOUS | Status: DC

## 2013-04-12 NOTE — Discharge Summary (Signed)
Physician Discharge Summary  Aaron [name] YQM:[id number] DOB: 21-Apr-1991 DOA: 04/11/2013  PCP: Kaleen Mask, MD  Admit date: 04/11/2013 Discharge date: 04/12/2013  Time spent: 50 minutes  Recommendations for Outpatient Follow-up:  1. Follow up PCP in one week  Discharge Diagnoses:  Principal Problem:   Heroin overdose Active Problems:   Leukocytosis   Discharge Condition: Stable  Diet recommendation: Regular  Filed Weights   04/12/13 0109 04/12/13 0500  Weight: 78.3 kg (172 lb 9.9 oz) 78 kg (171 lb 15.3 oz)    History of present illness:  22 year old [name] with no significant past medical history who was brought by EMS to [facility] ED 04/11/2013 after he was found unresponsive in a car after heroin use. In field, patient was given narcan 1 dose and became more alert. Patient reports no chest pain, no shortness of breath, no palpitations. No abdominal pain, no nausea or vomiting. No diarrhea or constipation. No fever or chills. No blood in stool or urine.  In ED, patient was more alert, BP 118/57 and HR 76 - 121. EKG 12 lead showed sinus tachycardia. His CBC revealed WBC count of 19.5.   Hospital Course:  Heroin overdose Resolved. Patient was given narcan by EMS after he was found to be unresponsive in car after heroin use. Patient became alert after narcan and was admitted for further management. Patient has been stable in the hospital. Wants to go home. He denies any suicidal or homicidal ideation, he denies depression. He was given the information for rehab but he did not seem to be interested in rehab at this time. Will d/c home   Hepatitis C Patient has mild transaminitis, but no symptoms of nausea, vomiting he is aware of the fact that he has hepatitis C, and will follow up GI as outpatient in [place], as he is trying to relocate to [place].Labs discussed with patient  Procedures: None Consultations:  None  Discharge Exam: Filed Vitals:   04/11/13 2250  04/12/13 0109 04/12/13 0500 04/12/13 0605  BP: 118/57 114/65  122/55  Pulse: 78 68  57  Temp:  98 F (36.7 C)  98.3 F (36.8 C)  TempSrc:  Oral  Oral  Resp: 17 16  16   Height:  5\' 7"  (1.702 m)    Weight:  78.3 kg (172 lb 9.9 oz) 78 kg (171 lb 15.3 oz)   SpO2: 100% 96%  99%    General: Appear in no acute distress Cardiovascular: s1s2 RRR Respiratory: Clear bilaterally  Discharge Instructions  Discharge Orders   Future Orders Complete By Expires     Diet - low sodium heart healthy  As directed     Increase activity slowly  As directed         Medication List    TAKE these medications       doxycycline 100 MG tablet  Commonly known as:  VIBRA-TABS  Take 100 mg by mouth daily.     lisdexamfetamine 70 MG capsule  Commonly known as:  VYVANSE  Take 70 mg by mouth daily.          The results of significant diagnostics from this hospitalization (including imaging, microbiology, ancillary and laboratory) are listed below for reference.    Significant Diagnostic Studies: No results found.  Microbiology: No results found for this or any previous visit (from the past 240 hour(s)).   Labs: Basic Metabolic Panel:  Recent Labs Lab 04/11/13 1902 04/12/13 0140 04/12/13 0531  NA 139 135 138  K  4.3 3.6 3.7  CL 101 100 104  CO2 27 27 25   GLUCOSE 117* 101* 111*  BUN 17 14 11   CREATININE 1.12 0.91 0.Aaron  CALCIUM 8.9 8.6 8.4   Liver Function Tests:  Recent Labs Lab 04/11/13 1902 04/12/13 0140 04/12/13 0531  AST 106* Aaron* 72*  ALT 269* 212* 195*  ALKPHOS 72 58 55  BILITOT 0.3 0.6 0.5  PROT 7.6 6.4 6.0  ALBUMIN 3.9 3.4* 3.0*   No results found for this basename: LIPASE, AMYLASE,  in the last 168 hours No results found for this basename: AMMONIA,  in the last 168 hours CBC:  Recent Labs Lab 04/11/13 1902 04/12/13 0140 04/12/13 0531  WBC 19.5* 11.8* 9.4  HGB 15.0 13.1 12.6*  HCT 42.6 37.1* 36.7*  MCV 87.5 87.3 88.4  PLT 286 237 213   Cardiac  Enzymes:  Recent Labs Lab 04/11/13 1901  TROPONINI <0.30   BNP: BNP (last 3 results) No results found for this basename: PROBNP,  in the last 8760 hours CBG:  Recent Labs Lab 04/11/13 1832 04/12/13 0730  GLUCAP 139* 93       Signed:  Annalena Piatt S  [facility] 04/12/2013, 12:33 PM

## 2013-04-12 NOTE — Care Management Note (Signed)
    Page 1 of 1   04/12/2013     1:28:58 PM   CARE MANAGEMENT NOTE 04/12/2013  Patient:  VIRGILIO, BROADHEAD   Account Number:  [id number]  Date Initiated:  04/12/2013  Documentation initiated by:  Lanier Clam  Subjective/Objective Assessment:   ADMITTED W/HEROINE OVERDOSE.RU:EAVWUJW ABUSE.     Action/Plan:   FROM HOME W/PARENTS.HAS PCP,PHARMACY.   Anticipated DC Date:  04/12/2013   Anticipated DC Plan:  HOME/SELF CARE      DC Planning Services  CM consult      Choice offered to / List presented to:             Status of service:  Completed, signed off Medicare Important Message given?   (If response is "NO", the following Medicare IM given date fields will be blank) Date Medicare IM given:   Date Additional Medicare IM given:    Discharge Disposition:  HOME/SELF CARE  Per UR Regulation:  Reviewed for med. necessity/level of care/duration of stay  If discussed at Long Length of Stay Meetings, dates discussed:    Comments:  04/12/13 Nike Southwell RN,BSN NCM [phone] PSYCH CONS-OPIOD DEPENDANCE.SW PROVIDED RESOURCES.NO FURTHER D/C NEEDS.

## 2013-04-12 NOTE — Progress Notes (Signed)
Discharge instructions given to patient.  Was not very attentive, anxious to leave.  Asked if any questions regarding discharge and pt said no.Pt walked out on own accord.  [name]

## 2013-04-12 NOTE — Progress Notes (Signed)
Clinical Social Work Department BRIEF PSYCHOSOCIAL ASSESSMENT 04/12/2013  Patient:  [name], [name]     Account Number:  [id number]     Admit date:  04/11/2013  Clinical Social Worker:  Hattie Perch  Date/Time:  04/12/2013 12:00 M  Referred by:  Physician  Date Referred:  04/12/2013 Referred for  Substance Abuse   Other Referral:   Interview type:  Patient Other interview type:    PSYCHOSOCIAL DATA Living Status:  ALONE Admitted from facility:   Level of care:   Primary support name:   Primary support relationship to patient:   Degree of support available:   poor    CURRENT CONCERNS Current Concerns  Substance Abuse   Other Concerns:    SOCIAL WORK ASSESSMENT / PLAN CSW asked to see the patient regarding ongoing substance abuse issues. patient was admitted with a heroine overdose. CSW met with patient. patient is alert and oriented X3. patient declines assessment by CSW and would not speak with CSW. he did accept the list of resources CSW provided but states he will not be utilizing them.   Assessment/plan status:   Other assessment/ plan:   Information/referral to community resources:    PATIENT'S/FAMILY'S RESPONSE TO PLAN OF CARE: patient is not interested in any assistance from this CSW.

## 2013-04-13 LAB — URINE CULTURE: Colony Count: 30000

## 2013-04-18 ENCOUNTER — Emergency Department (HOSPITAL_COMMUNITY)
Admission: EM | Admit: 2013-04-18 | Discharge: 2013-04-18 | Disposition: A | Discharge status: Home / Self Care | Payer: Medicaid Other | Attending: Emergency Medicine | Admitting: Emergency Medicine

## 2013-04-18 ENCOUNTER — Encounter (HOSPITAL_COMMUNITY): Payer: Self-pay | Admitting: Emergency Medicine

## 2013-04-18 DIAGNOSIS — Y9389 Activity, other specified: Secondary | ICD-10-CM | POA: Insufficient documentation

## 2013-04-18 DIAGNOSIS — Y9289 Other specified places as the place of occurrence of the external cause: Secondary | ICD-10-CM | POA: Insufficient documentation

## 2013-04-18 DIAGNOSIS — T43591A Poisoning by other antipsychotics and neuroleptics, accidental (unintentional), initial encounter: Secondary | ICD-10-CM | POA: Insufficient documentation

## 2013-04-18 DIAGNOSIS — T424X4A Poisoning by benzodiazepines, undetermined, initial encounter: Secondary | ICD-10-CM | POA: Insufficient documentation

## 2013-04-18 DIAGNOSIS — F17200 Nicotine dependence, unspecified, uncomplicated: Secondary | ICD-10-CM | POA: Insufficient documentation

## 2013-04-18 DIAGNOSIS — R000 Tachycardia, unspecified: Secondary | ICD-10-CM | POA: Insufficient documentation

## 2013-04-18 DIAGNOSIS — Z79899 Other long term (current) drug therapy: Secondary | ICD-10-CM | POA: Insufficient documentation

## 2013-04-18 DIAGNOSIS — F909 Attention-deficit hyperactivity disorder, unspecified type: Secondary | ICD-10-CM | POA: Insufficient documentation

## 2013-04-18 MED ORDER — SODIUM CHLORIDE 0.9 % IV BOLUS (SEPSIS)
1000.0000 mL | Freq: Once | INTRAVENOUS | Status: AC
  Administered 2013-04-18: 1000 mL via INTRAVENOUS

## 2013-04-18 NOTE — ED Notes (Signed)
UEA:[id number]<WJ> Expected date:<BR> Expected time:<BR> Means of arrival:<BR> Comments:<BR> EMS/21 yo with heroin OD-agonal-now responsive after Narcan 2 mg

## 2013-04-18 NOTE — ED Provider Notes (Signed)
History     CSN: [id number]  Arrival date & time 04/18/13  2009   First MD Initiated Contact with Patient 04/18/13 2011     Level 5 caveat Chief Complaint  Patient presents with  . Drug Overdose    (Consider location/radiation/quality/duration/timing/severity/associated sxs/prior treatment) Patient is a 22 y.o. male presenting with Overdose.  Drug Overdose   22 y.o. Male unresponsive prehospital treated with narcan by ems and now awake and alert.  States took xanax and shot up heroin but does not know when and does not know how he was found and resuscitated.  RN states ems told her mother found and called 911.   Past Medical History  Diagnosis Date  . Heroin abuse   . ADHD (attention deficit hyperactivity disorder)     History reviewed. No pertinent past surgical history.  No family history on file.  History  Substance Use Topics  . Smoking status: Current Every Day Smoker -- 1.00 packs/day for 5 years    Types: Cigarettes  . Smokeless tobacco: Never Used  . Alcohol Use: Yes     Comment: occasional use      Review of Systems  All other systems reviewed and are negative.    Allergies  Cefzil  Home Medications   Current Outpatient Rx  Name  Route  Sig  Dispense  Refill  . doxycycline (VIBRA-TABS) 100 MG tablet   Oral   Take 100 mg by mouth daily.         Marland Kitchen lisdexamfetamine (VYVANSE) 70 MG capsule   Oral   Take 70 mg by mouth daily.           There were no vitals taken for this visit.  Physical Exam  Nursing note and vitals reviewed. Constitutional: He is oriented to person, place, and time. He appears well-developed and well-nourished.  HENT:  Head: Normocephalic and atraumatic.  Right Ear: External ear normal.  Left Ear: External ear normal.  Eyes: Conjunctivae are normal. Pupils are equal, round, and reactive to light.  Neck: Normal range of motion. Neck supple.  Cardiovascular: Tachycardia present.   Pulmonary/Chest: Effort normal and  breath sounds normal.  Abdominal: Soft. Bowel sounds are normal.  Musculoskeletal: Normal range of motion.  Needle marks right antecubital fossa  Neurological: He is alert and oriented to person, place, and time.  Skin: Skin is warm and dry.  Psychiatric:  jittery    ED Course  Procedures (including critical care time)  Labs Reviewed - No data to display No results found.   No diagnosis found.    MDM  Patient observed for 2.25 hours here and no relapse of od symptoms.  Patient discharged in improved condition.       Hilario Quarry, MD 04/18/13 2223

## 2013-04-18 NOTE — ED Notes (Addendum)
Per EMS pt is from home, family found about 45 mins ago agonal breathing. 2mg  narcan given  IV in 16g. Left AC. Pt LOC x4. 1 min after administration of narcan.  Pt states heroin and Xanax usage. Pt was here on 04/11/13 for same thing.

## 2013-08-06 ENCOUNTER — Encounter (HOSPITAL_COMMUNITY): Payer: Self-pay | Admitting: Emergency Medicine

## 2013-08-06 DIAGNOSIS — F909 Attention-deficit hyperactivity disorder, unspecified type: Secondary | ICD-10-CM | POA: Insufficient documentation

## 2013-08-06 DIAGNOSIS — F17200 Nicotine dependence, unspecified, uncomplicated: Secondary | ICD-10-CM | POA: Insufficient documentation

## 2013-08-06 DIAGNOSIS — F1110 Opioid abuse, uncomplicated: Secondary | ICD-10-CM | POA: Insufficient documentation

## 2013-08-06 DIAGNOSIS — Z79899 Other long term (current) drug therapy: Secondary | ICD-10-CM | POA: Insufficient documentation

## 2013-08-06 DIAGNOSIS — Z8619 Personal history of other infectious and parasitic diseases: Secondary | ICD-10-CM | POA: Insufficient documentation

## 2013-08-06 LAB — CBC WITH DIFFERENTIAL/PLATELET
Basophils Relative: 1 % (ref 0–1)
Hemoglobin: 14.8 g/dL (ref 13.0–17.0)
Lymphocytes Relative: 38 % (ref 12–46)
MCHC: 36.1 g/dL — ABNORMAL HIGH (ref 30.0–36.0)
Monocytes Relative: 9 % (ref 3–12)
Neutro Abs: 2.8 10*3/uL (ref 1.7–7.7)
Neutrophils Relative %: 49 % (ref 43–77)
RBC: 4.78 MIL/uL (ref 4.22–5.81)
WBC: 5.6 10*3/uL (ref 4.0–10.5)

## 2013-08-06 NOTE — ED Notes (Signed)
PT. WEARING PAPER SCRUBS / WANDED BY SECURITY AT TRIAGE .

## 2013-08-06 NOTE — ED Notes (Signed)
PT. REQUESTING DETOX FOR HEROIN ADDICTION , LAST HEROIN THIS EVENING , DENIES SOB OR CHEST PAIN , NO SUICIDAL IDEATION .

## 2013-08-07 ENCOUNTER — Emergency Department (HOSPITAL_COMMUNITY)
Admission: EM | Admit: 2013-08-07 | Discharge: 2013-08-07 | Disposition: A | Discharge status: Home / Self Care | Payer: Medicaid Other | Attending: Emergency Medicine | Admitting: Emergency Medicine

## 2013-08-07 DIAGNOSIS — F1110 Opioid abuse, uncomplicated: Secondary | ICD-10-CM

## 2013-08-07 HISTORY — DX: Unspecified viral hepatitis C without hepatic coma: B19.20

## 2013-08-07 LAB — COMPREHENSIVE METABOLIC PANEL
AST: 29 U/L (ref 0–37)
Albumin: 3.9 g/dL (ref 3.5–5.2)
Alkaline Phosphatase: 52 U/L (ref 39–117)
BUN: 13 mg/dL (ref 6–23)
CO2: 27 mEq/L (ref 19–32)
Chloride: 104 mEq/L (ref 96–112)
Potassium: 4.2 mEq/L (ref 3.5–5.1)
Total Bilirubin: 0.3 mg/dL (ref 0.3–1.2)

## 2013-08-07 LAB — RAPID URINE DRUG SCREEN, HOSP PERFORMED
Barbiturates: NOT DETECTED
Cocaine: NOT DETECTED
Tetrahydrocannabinol: POSITIVE — AB

## 2013-08-07 MED ORDER — DICYCLOMINE HCL 20 MG PO TABS: 20.0000 mg | ORAL_TABLET | Freq: Four times a day (QID) | ORAL | Status: DC | PRN

## 2013-08-07 MED ORDER — ALUM & MAG HYDROXIDE-SIMETH 200-200-20 MG/5ML PO SUSP: 30.0000 mL | ORAL | Status: DC | PRN

## 2013-08-07 MED ORDER — NAPROXEN 250 MG PO TABS: 500.0000 mg | ORAL_TABLET | Freq: Two times a day (BID) | ORAL | Status: DC | PRN

## 2013-08-07 MED ORDER — HYDROXYZINE HCL 25 MG PO TABS: 25.0000 mg | ORAL_TABLET | Freq: Four times a day (QID) | ORAL | Status: DC | PRN

## 2013-08-07 MED ORDER — METHOCARBAMOL 500 MG PO TABS: 500.0000 mg | ORAL_TABLET | Freq: Three times a day (TID) | ORAL | Status: DC | PRN

## 2013-08-07 MED ORDER — LOPERAMIDE HCL 2 MG PO CAPS: 2.0000 mg | ORAL_CAPSULE | ORAL | Status: DC | PRN

## 2013-08-07 MED ORDER — ONDANSETRON HCL 4 MG PO TABS: 4.0000 mg | ORAL_TABLET | Freq: Three times a day (TID) | ORAL | Status: DC | PRN

## 2013-08-07 MED ORDER — IBUPROFEN 400 MG PO TABS: 600.0000 mg | ORAL_TABLET | Freq: Three times a day (TID) | ORAL | Status: DC | PRN

## 2013-08-07 MED ORDER — ONDANSETRON 4 MG PO TBDP: 4.0000 mg | ORAL_TABLET | Freq: Four times a day (QID) | ORAL | Status: DC | PRN

## 2013-08-07 MED ORDER — ACETAMINOPHEN 325 MG PO TABS: 650.0000 mg | ORAL_TABLET | ORAL | Status: DC | PRN

## 2013-08-07 NOTE — BH Assessment (Signed)
Tele Assessment Note   [name] is an 23 y.o. male.  Patient came to [facility] seeking detox from heroin.  He went to [facility] on 08/18 to get suboxone therapy and they said that he needed to be clean for 48 hours.  Patient did use on 08/19 just prior to coming to [facility].  Patient has been using "just under a half gram" of heroin per day for the last 4 months.  Patient has been using heroin for the last year however.  Also using marijuana but patient did not want to elaborate on it.  Patient denies any HI, SI, A/V hallucinations.  Patient has been to [facility] and [facility] in January and March respectively.  Assessment paperwork to be sent to [facility] when they have a bed available.  At this time they have no bed availability nor does [facility]. Axis I: 304.00 Opioid dependence Axis II: Deferred Axis III:  Past Medical History  Diagnosis Date  . Heroin abuse   . ADHD (attention deficit hyperactivity disorder)   . Hepatitis C    Axis IV: economic problems, occupational problems and other psychosocial or environmental problems Axis V: 31-40 impairment in reality testing  Past Medical History:  Past Medical History  Diagnosis Date  . Heroin abuse   . ADHD (attention deficit hyperactivity disorder)   . Hepatitis C     History reviewed. No pertinent past surgical history.  Family History: No family history on file.  Social History:  reports that he has been smoking Cigarettes.  He has a 5 pack-year smoking history. He has never used smokeless tobacco. He reports that  drinks alcohol. He reports that he uses illicit drugs (Marijuana, Heroin, and Other-see comments).  Additional Social History:  Alcohol / Drug Use Pain Medications: Abusing heroin Prescriptions: None Over the Counter: N/A History of alcohol / drug use?: Yes Withdrawal Symptoms: Cramps;Diarrhea;Fever / Chills;Irritability;Nausea / Vomiting;Patient aware of relationship between substance abuse and physical/medical  complications;Sweats;Weakness Substance #1 Name of Substance 1: Heroin 1 - Age of First Use: 22 years of age 49 - Amount (size/oz): "Just under half a gram" 1 - Frequency: Daily 1 - Duration: One year.  Last four months at that rate. 1 - Last Use / Amount: 08/19 just prior to arrival at ED. Substance #2 Name of Substance 2: Marijuana 2 - Age of First Use: 22 years old 2 - Amount (size/oz): Unknown 2 - Frequency: Pt would not divulge 2 - Duration: On-going 2 - Last Use / Amount: 08/19 amount unknown  CIWA: CIWA-Ar BP: 101/42 mmHg Pulse Rate: 98 COWS:    Allergies:  Allergies  Allergen Reactions  . Cefzil [Cefprozil] Hives    Home Medications:  (Not in a hospital admission)  OB/GYN Status:  No LMP for male patient.  General Assessment Data Location of Assessment: [facility] ED Is this a Tele or Face-to-Face Assessment?: Tele Assessment Is this an Initial Assessment or a Re-assessment for this encounter?: Initial Assessment Living Arrangements: Parent Can pt return to current living arrangement?: Yes Admission Status: Voluntary Is patient capable of signing voluntary admission?: Yes Transfer from: Acute Hospital Referral Source: Self/Family/Friend     [facility] Crisis Care Plan Living Arrangements: Parent Name of Psychiatrist: None Name of Therapist: N/A     Risk to self Suicidal Ideation: No Suicidal Intent: No Is patient at risk for suicide?: No Suicidal Plan?: No Access to Means: No What has been your use of drugs/alcohol within the last 12 months?: Heroin use daily Previous Attempts/Gestures:  No How many times?: 0 Other Self Harm Risks: SA issues Triggers for Past Attempts: None known Intentional Self Injurious Behavior: None Family Suicide History: No Recent stressful life event(s): Other (Comment) (None identified) Persecutory voices/beliefs?: No Depression: No Depression Symptoms:  (Denies depressive symptoms) Substance abuse history and/or treatment for  substance abuse?: Yes Suicide prevention information given to non-admitted patients: Not applicable  Risk to Others Homicidal Ideation: No Thoughts of Harm to Others: No Current Homicidal Intent: No Current Homicidal Plan: No Access to Homicidal Means: No Identified Victim: No one History of harm to others?: No Assessment of Violence: None Noted Violent Behavior Description: Pt irritable but cooperative Does patient have access to weapons?: No Criminal Charges Pending?: No Does patient have a court date: No  Psychosis Hallucinations: None noted Delusions: None noted  Mental Status Report Appear/Hygiene: Disheveled Eye Contact: Good Motor Activity: Freedom of movement;Unremarkable Speech: Logical/coherent Level of Consciousness: Alert Mood: Sullen;Ambivalent Affect: Irritable Anxiety Level: None Thought Processes: Coherent;Relevant Judgement: Unimpaired Orientation: Person;Place;Time;Situation Obsessive Compulsive Thoughts/Behaviors: None  Cognitive Functioning Concentration: Normal Memory: Recent Intact;Remote Intact IQ: Average Insight: Poor Impulse Control: Poor Appetite: Good Weight Loss: 0 Weight Gain: 0 Sleep: No Change Total Hours of Sleep: 7 Vegetative Symptoms: None  ADLScreening [facility] Assessment Services) Patient's cognitive ability adequate to safely complete daily activities?: Yes Patient able to express need for assistance with ADLs?: Yes Independently performs ADLs?: Yes (appropriate for developmental age)  Prior Inpatient Therapy Prior Inpatient Therapy: Yes Prior Therapy Dates: January '14 & March '14 Prior Therapy Facilty/Provider(s): [facility] Reason for Treatment: SA  Prior Outpatient Therapy Prior Outpatient Therapy: No Prior Therapy Dates: N/A Prior Therapy Facilty/Provider(s): N/A Reason for Treatment: N/A  ADL Screening (condition at time of admission) Patient's cognitive ability adequate to safely complete daily activities?:  Yes Is the patient deaf or have difficulty hearing?: No Does the patient have difficulty seeing, even when wearing glasses/contacts?: No Does the patient have difficulty concentrating, remembering, or making decisions?: No Patient able to express need for assistance with ADLs?: Yes Does the patient have difficulty dressing or bathing?: No Independently performs ADLs?: Yes (appropriate for developmental age) Does the patient have difficulty walking or climbing stairs?: No Weakness of Legs: None Weakness of Arms/Hands: None       Abuse/Neglect Assessment (Assessment to be complete while patient is alone) Physical Abuse: Denies Verbal Abuse: Denies Sexual Abuse: Denies Exploitation of patient/patient's resources: Denies Self-Neglect: Denies     Merchant navy officer (For Healthcare) Advance Directive: Patient does not have advance directive;Patient would not like information    Additional Information 1:1 In Past 12 Months?: No CIRT Risk: No Elopement Risk: No Does patient have medical clearance?: Yes     Disposition:  Disposition Initial Assessment Completed for this Encounter: Yes Disposition of Patient: Inpatient treatment program;Referred to ([facility]) Type of inpatient treatment program: Adult Patient referred to: Mervyn Gay 08/07/2013 6:36 AM

## 2013-08-07 NOTE — ED Notes (Signed)
PATIENT CALM AND COOPERATIVE. HE DENIES ANY SYMPTOMS OF WITHDRAWAL. STATES HE WAS SENT HERE BY HOLISTIC SERVICES TO DETOX FROM HEROIN SO HE COULD START ON SUBOXONE. STATES THE SUBOXONE "MAKES ME HIGH". WHEN ASKED PT WHY HE WANTED TO GO ON SUBOXONE HE STATES HE MIGHT AS WELL BE ON IT BECAUSE HE NEEDS TO BE ON SOMETHING AND HE DOESN'T WANT TO BE ON DOPE.

## 2013-08-07 NOTE — ED Notes (Signed)
Patient's belongings were inventoried and placed in bin.   All noted.   No valuable to security.

## 2013-08-07 NOTE — ED Notes (Signed)
Dr Wilkie Aye in to reeval pt

## 2013-08-07 NOTE — ED Provider Notes (Signed)
I was asked to reevaluate the patient for discharge. He has shown no evidence of withdrawal while being held in the emergency room. The patient would like to be discharged home for an appointment tomorrow. At this time I feel he is appropriate for discharge.  Shon Baton, MD 08/07/13 1344

## 2013-08-07 NOTE — BH Assessment (Signed)
Pt is not appropriate for detox at [facility] b/c his g-friend is currently at the facility. [facility] does not accept [facility] for their detox program. Pt referred to [facility], pending review.

## 2013-08-07 NOTE — ED Notes (Signed)
Patient received meal tray.  Patient ambulated to restroom and tolerated well.

## 2013-08-07 NOTE — ED Notes (Addendum)
Patient tolerated move from A-1 to C26 well.   Patient tele-ACT set up and EMT in room with patient.

## 2013-08-07 NOTE — ED Notes (Signed)
Patient belongings given back to patient:  1 white tee shirt.  1 pair tan short.  1 pair socks.  1 pair of flip flops.   1 brown belt.  1 pair of blue/black shorts.  1 opened pack of cigarettes.  0 valuables in security.  Patient signed for belongings.

## 2013-08-07 NOTE — ED Notes (Signed)
SPOKE WITH [facility]. THEY ARE FAMILIAR WITH PT. THE ADVISE THEY DO NOT DO SUBOXONE TREATMENT THERE AND THEY DO DISCOURAGE THE USE OF SUBOXONE. THEY ADVISE THEY HAVE NOTHING TO DO WITH HIS SUBOXONE. HE HAS TOLD [facility] THAT HE WANTS TO GO TO [facility] FOR SUBOXONE THERAPY. PT IS IN DRUG COURT AND HIS TREATMENT IS COURT ORDERED. HE IS ON PROBATION.

## 2013-08-07 NOTE — ED Notes (Signed)
Patient ambulated to restroom and tolerated well.  

## 2013-08-07 NOTE — ED Provider Notes (Signed)
  CSN: [id number]     Arrival date & time 08/06/13  2300 History     First MD Initiated Contact with Patient 08/07/13 0158     Chief Complaint  Patient presents with  . Addiction Problem   (Consider location/radiation/quality/duration/timing/severity/associated sxs/prior Treatment) HPI Pt with history of heroin abuse presents requesting detox. States his last use was just before coming here. Girlfriend is also here for same, she has been waiting placement for 12hrs. He denies any somatic complaints. No SI/HI. No EtOH abuse.   Past Medical History  Diagnosis Date  . Heroin abuse   . ADHD (attention deficit hyperactivity disorder)   . Hepatitis C    History reviewed. No pertinent past surgical history. No family history on file. History  Substance Use Topics  . Smoking status: Current Every Day Smoker -- 1.00 packs/day for 5 years    Types: Cigarettes  . Smokeless tobacco: Never Used  . Alcohol Use: Yes     Comment: occasional use    Review of Systems All other systems reviewed and are negative except as noted in HPI.   Allergies  Cefzil  Home Medications   Current Outpatient Rx  Name  Route  Sig  Dispense  Refill  . lisdexamfetamine (VYVANSE) 70 MG capsule   Oral   Take 70 mg by mouth daily.          BP 125/78  Pulse 81  Temp(Src) 98.2 F (36.8 C) (Oral)  Resp 18  SpO2 100% Physical Exam  Nursing note and vitals reviewed. Constitutional: He is oriented to person, place, and time. He appears well-developed and well-nourished.  HENT:  Head: Normocephalic and atraumatic.  Eyes: EOM are normal. Pupils are equal, round, and reactive to light.  Neck: Normal range of motion. Neck supple.  Cardiovascular: Normal rate, normal heart sounds and intact distal pulses.   Pulmonary/Chest: Effort normal and breath sounds normal.  Abdominal: Bowel sounds are normal. He exhibits no distension. There is no tenderness.  Musculoskeletal: Normal range of motion. He exhibits no  edema and no tenderness.  Neurological: He is alert and oriented to person, place, and time. He has normal strength. No cranial nerve deficit or sensory deficit.  Skin: Skin is warm and dry. No rash noted.  Psychiatric: He has a normal mood and affect.    ED Course   Procedures (including critical care time)  Labs Reviewed  CBC WITH DIFFERENTIAL - Abnormal; Notable for the following:    MCHC 36.1 (*)    All other components within normal limits  COMPREHENSIVE METABOLIC PANEL - Abnormal; Notable for the following:    Glucose, Bld 111 (*)    All other components within normal limits  ETHANOL  URINE RAPID DRUG SCREEN (HOSP PERFORMED)   No results found. No diagnosis found.  MDM  Move to Pod C pending evaluation and placement in detox facility. Spoke with [facility].   Charles B. Bernette Mayers, MD 08/07/13 4098

## 2013-08-07 NOTE — ED Notes (Signed)
In to speak with patient about his plan of care. Pt informed we are activly seeking placement for him. Pt states he has an appointment at 10 am at [facility]  In the morning at 10 am. States he wants to keep this appointment to get on suboxone. Pt cautioned that he must not use any opiates and must be in withdrawal when he is induced on suboxone. He states he will not use if he is discharged home. He states he will have transportation to the clinic in he morning

## 2013-08-31 ENCOUNTER — Emergency Department (HOSPITAL_COMMUNITY)
Admission: EM | Admit: 2013-08-31 | Discharge: 2013-08-31 | Disposition: A | Discharge status: Home / Self Care | Payer: Medicaid Other | Attending: Emergency Medicine | Admitting: Emergency Medicine

## 2013-08-31 ENCOUNTER — Emergency Department (HOSPITAL_COMMUNITY): Payer: Medicaid Other

## 2013-08-31 ENCOUNTER — Encounter (HOSPITAL_COMMUNITY): Payer: Self-pay | Admitting: *Deleted

## 2013-08-31 DIAGNOSIS — Y929 Unspecified place or not applicable: Secondary | ICD-10-CM | POA: Insufficient documentation

## 2013-08-31 DIAGNOSIS — F909 Attention-deficit hyperactivity disorder, unspecified type: Secondary | ICD-10-CM | POA: Insufficient documentation

## 2013-08-31 DIAGNOSIS — Z792 Long term (current) use of antibiotics: Secondary | ICD-10-CM | POA: Insufficient documentation

## 2013-08-31 DIAGNOSIS — F1110 Opioid abuse, uncomplicated: Secondary | ICD-10-CM | POA: Insufficient documentation

## 2013-08-31 DIAGNOSIS — Z8619 Personal history of other infectious and parasitic diseases: Secondary | ICD-10-CM | POA: Insufficient documentation

## 2013-08-31 DIAGNOSIS — S51009A Unspecified open wound of unspecified elbow, initial encounter: Secondary | ICD-10-CM | POA: Insufficient documentation

## 2013-08-31 DIAGNOSIS — F17200 Nicotine dependence, unspecified, uncomplicated: Secondary | ICD-10-CM | POA: Insufficient documentation

## 2013-08-31 DIAGNOSIS — IMO0002 Reserved for concepts with insufficient information to code with codable children: Secondary | ICD-10-CM

## 2013-08-31 DIAGNOSIS — Y9389 Activity, other specified: Secondary | ICD-10-CM | POA: Insufficient documentation

## 2013-08-31 DIAGNOSIS — W01119A Fall on same level from slipping, tripping and stumbling with subsequent striking against unspecified sharp object, initial encounter: Secondary | ICD-10-CM | POA: Insufficient documentation

## 2013-08-31 MED ORDER — CLINDAMYCIN HCL 150 MG PO CAPS: 450.0000 mg | ORAL_CAPSULE | Freq: Three times a day (TID) | ORAL | Status: DC

## 2013-08-31 NOTE — ED Notes (Signed)
Pt reports putting left arm through glass table midnight last night and has laceration to left elbow that was wrapped by ems last night. Bleeding controlled, reports up to date tetanus.

## 2013-08-31 NOTE — ED Provider Notes (Addendum)
CSN: [id number]     Arrival date & time 08/31/13  1119 History   First MD Initiated Contact with Patient 08/31/13 1123     Chief Complaint  Patient presents with  . Laceration   (Consider location/radiation/quality/duration/timing/severity/associated sxs/prior Treatment) Patient is a 22 y.o. male presenting with skin laceration. The history is provided by the patient. No language interpreter was used.  Laceration [name] is a 22 year old male with past medical history of heroin abuse, hepatitis C, ADHD presenting to the emergency department with laceration to the left elbow that occurred last night at approximately 12:00 AM. Patient reported that he was coming down the stairs and as he was coming down the stairs he tripped over his dog and fell into the glass door. Patient reports that there is pain with motion, described as a burning itching sensation with motion and at rest. Reported that nothing makes the pain better. Patient reported that EMS was called and that EMS cleaned the wound, patient reported that he was offered to be brought to emergency department to get the laceration taken care of, but patient reported that the EMS would not allow his girlfriend on the truck and he said that he could not leave her - patient refused to come to the ED to get his laceration taken care of because of this. Reported that he has got his tetanus within the last 5, reported 2 years ago. Patient denied alcohol use. Denied headache, dizziness, loss of sensation, numbness, tingling, prior injury to the elbow, chest pain, shortness of breath, difficulty breathing, weakness. PCP none  Past Medical History  Diagnosis Date  . Heroin abuse   . ADHD (attention deficit hyperactivity disorder)   . Hepatitis C    History reviewed. No pertinent past surgical history. History reviewed. No pertinent family history. History  Substance Use Topics  . Smoking status: Current Every Day Smoker -- 1.00 packs/day  for 5 years    Types: Cigarettes  . Smokeless tobacco: Never Used  . Alcohol Use: Yes     Comment: occasional use    Review of Systems  Respiratory: Negative for chest tightness and shortness of breath.   Cardiovascular: Negative for chest pain.  Skin: Positive for wound.  Neurological: Negative for dizziness, weakness and numbness.  All other systems reviewed and are negative.    Allergies  Cefzil  Home Medications   Current Outpatient Rx  Name  Route  Sig  Dispense  Refill  . doxycycline (VIBRAMYCIN) 100 MG capsule   Oral   Take 100 mg by mouth daily.         Marland Kitchen lisdexamfetamine (VYVANSE) 70 MG capsule   Oral   Take 70 mg by mouth daily.         . clindamycin (CLEOCIN) 150 MG capsule   Oral   Take 3 capsules (450 mg total) by mouth 3 (three) times daily.   90 capsule   0    BP 126/75  Pulse 54  Temp(Src) 98.7 F (37.1 C) (Oral)  Resp 16  SpO2 97% Physical Exam  Nursing note and vitals reviewed. Constitutional: He is oriented to person, place, and time. He appears well-developed and well-nourished. No distress.  HENT:  Head: Normocephalic and atraumatic.  Eyes: Conjunctivae and EOM are normal. Pupils are equal, round, and reactive to light. Right eye exhibits no discharge. Left eye exhibits no discharge.  Neck: Normal range of motion. Neck supple.  Cardiovascular: Normal rate, regular rhythm and normal heart sounds.  Exam  reveals no friction rub.   No murmur heard. Pulses:      Radial pulses are 2+ on the right side, and 2+ on the left side.  Pulmonary/Chest: Effort normal and breath sounds normal. No respiratory distress. He has no wheezes. He has no rales.  Musculoskeletal: Normal range of motion.  Full flexion, extension, supination, pronation of the left elbow with negative discomfort identified. Full ROM to extremities.   Lymphadenopathy:    He has no cervical adenopathy.  Neurological: He is alert and oriented to person, place, and time. He  exhibits normal muscle tone. Coordination normal.  Strength 5+/5+ to upper extremities bilaterally with resistance applied, equal distribution identified. Sensation intact to upper extremities bilaterally with differentiation to sharp and dull touch  Skin: He is not diaphoretic.  Approximately 3 cm laceration to the lateral aspects of the elbow, near lateral epicondyle, of the left elbow. Flap present. Bleeding controlled.  Psychiatric: He has a normal mood and affect. His behavior is normal. Thought content normal.    ED Course  Procedures (including critical care time)  1:07 PM Spoke with Dr. Fredderick Phenix regarding case. Recommended to loosely approximate the skin and thoroughly clean and irrigate. Recommended patient to be placed on antibiotics and for followup as outpatient.  LACERATION REPAIR Performed by: Raymon Mutton Authorized by: Raymon Mutton Consent: Verbal consent obtained. Risks and benefits: risks, benefits and alternatives were discussed Consent given by: patient Patient identity confirmed: provided demographic data Prepped and Draped in normal sterile fashion Wound explored  Laceration Location: Lateral epicondyles of left elbow  Laceration Length: 3 cm  No Foreign Bodies seen or palpated  Anesthesia: local infiltration  Local anesthetic: lidocaine 2% % with epinephrine  Anesthetic total: 5 ml  Irrigation method: Thoroughly with syringe approximately 1000 mL of saline and the beta-iodine use  Amount of cleaning: extensive  Skin closure: loose  Number of sutures: 3   Technique: Single interrupted, 5-0 Ethilon   Patient tolerance: Patient tolerated the procedure well with no immediate complications. Negative involvement of tendon and deep tendon. Good hemostasis.    Labs Review Labs Reviewed - No data to display Imaging Review Dg Elbow Complete Left  08/31/2013   CLINICAL DATA:  22 year old male with laceration. Foreign body.  EXAM: LEFT ELBOW -  COMPLETE 3+ VIEW  COMPARISON:  None.  FINDINGS: No evidence of joint effusion. Bone mineralization is within normal limits. Joint spaces and alignment preserved.  Soft tissue injury along the radial aspect (image 1). The radial head appears intact. No radiopaque foreign body identified.  IMPRESSION: Soft tissue injury. No radiopaque foreign body identified. No acute fracture or dislocation identified about the left elbow.   Electronically Signed   By: Augusto Gamble M.D.   On: 08/31/2013 13:26    MDM   1. Laceration     Patient presenting to emergency apartment with laceration to left elbow that occurred at approximately midnight last night. Bleeding is controlled. Patient reports that he is up to date with his tetanus, reported that he's got his tetanus within the past 2 years. Alert and oriented. Full range of motion to left elbow with negative discomfort identified. Strength intact. Sensation intact. Pulses palpable, radial 2+ bilaterally. Full range of motion to the left wrist, left hand and left digits without discomfort or difficulty identified. Approximately 3 cm laceration to the lateral aspect of the left elbow, near the lateral epicondyle - flap present. Bleeding controlled.  X-ray left elbow no radiopaque foreign body identified. No fracture  or dislocation identified. Aware that laceration has occurred more than 6 hours ago, laceration has been present for at least 12 hours. The benefits outweigh the risks for skin closure, and skin closure not to be performed will most likely lead to infection since patient has a flap of the skin. Discussed case with Dr. Fredderick Phenix who recommended to loosely approximate and apply Steri-Strips. Wound was thoroughly cleaned with saline, approximately 1000 mL, and beta iodine used. Wound explored with negative findings of foreign bodies. Loosely closed laceration since it has been at least 12 hours since the event, with 3 sutures and Steri-Strips. Negative involvement of  tendon and deep tendon. Good hemostasis. Discussed with patient his risk of infection due to not getting the wound cleaned thoroughly and closed at onset of the laceration. Discussed with patient that a scar will be left secondary due to healing process beginning. Discussed with patient that even though the wound was cleaned thoroughly at this visit to the ED the risk of infection is still high and that he needs to closely monitor the wound and healing process and thoroughly clean the wound every single day. Patient understood and agreed to this discussion.  Patient stable, afebrile. Negative signs of infection identified at this moment. Discharged patient with antibiotics for bacterial coverage. Discussed with patient wound care. Discussed with patient to rest and stay hydrated. Discussed with patient to report to primary care provider within 5 days to get sutures removed and for wound to be checked. Educated patient on signs of infection and what to watch out for. Discussed with patient that his risk of infection is much higher due to waiting too long for wound to be tended to. Discussed with patient to closely monitor symptoms if symptoms are to worsen or change to report back to emergency department-strict return instructions given. Patient agreed to plan of care, understood, all questions answered.     Raymon Mutton, PA-C 09/01/13 [address] PA-C 09/02/13 [phone]

## 2013-09-01 NOTE — ED Provider Notes (Signed)
Medical screening examination/treatment/procedure(s) were performed by non-physician practitioner and as supervising physician I was immediately available for consultation/collaboration.  Raeford Razor, MD 09/01/13 [phone]

## 2013-09-05 NOTE — ED Provider Notes (Signed)
Medical screening examination/treatment/procedure(s) were performed by non-physician practitioner and as supervising physician I was immediately available for consultation/collaboration.  Raeford Razor, MD 09/05/13 1038

## 2014-04-08 ENCOUNTER — Emergency Department (HOSPITAL_COMMUNITY)
Admission: EM | Admit: 2014-04-08 | Discharge: 2014-04-08 | Discharge status: Against Medical Advice | Payer: Medicaid Other | Attending: Emergency Medicine | Admitting: Emergency Medicine

## 2014-04-08 ENCOUNTER — Emergency Department (HOSPITAL_COMMUNITY): Payer: Medicaid Other

## 2014-04-08 DIAGNOSIS — Z79899 Other long term (current) drug therapy: Secondary | ICD-10-CM | POA: Insufficient documentation

## 2014-04-08 DIAGNOSIS — F17200 Nicotine dependence, unspecified, uncomplicated: Secondary | ICD-10-CM | POA: Insufficient documentation

## 2014-04-08 DIAGNOSIS — IMO0002 Reserved for concepts with insufficient information to code with codable children: Secondary | ICD-10-CM | POA: Insufficient documentation

## 2014-04-08 DIAGNOSIS — F909 Attention-deficit hyperactivity disorder, unspecified type: Secondary | ICD-10-CM | POA: Insufficient documentation

## 2014-04-08 DIAGNOSIS — S0990XA Unspecified injury of head, initial encounter: Secondary | ICD-10-CM | POA: Insufficient documentation

## 2014-04-08 DIAGNOSIS — S0081XA Abrasion of other part of head, initial encounter: Secondary | ICD-10-CM

## 2014-04-08 DIAGNOSIS — Z792 Long term (current) use of antibiotics: Secondary | ICD-10-CM | POA: Insufficient documentation

## 2014-04-08 DIAGNOSIS — W1809XA Striking against other object with subsequent fall, initial encounter: Secondary | ICD-10-CM | POA: Insufficient documentation

## 2014-04-08 DIAGNOSIS — S0180XA Unspecified open wound of other part of head, initial encounter: Secondary | ICD-10-CM | POA: Insufficient documentation

## 2014-04-08 DIAGNOSIS — Z8619 Personal history of other infectious and parasitic diseases: Secondary | ICD-10-CM | POA: Insufficient documentation

## 2014-04-08 DIAGNOSIS — S01111A Laceration without foreign body of right eyelid and periocular area, initial encounter: Secondary | ICD-10-CM

## 2014-04-08 DIAGNOSIS — Y9389 Activity, other specified: Secondary | ICD-10-CM | POA: Insufficient documentation

## 2014-04-08 DIAGNOSIS — Y92410 Unspecified street and highway as the place of occurrence of the external cause: Secondary | ICD-10-CM | POA: Insufficient documentation

## 2014-04-08 NOTE — ED Notes (Signed)
Pt was talking on the phone, yelling profanities.  Pt told that needed to watch his language.  Pt then stated that he wanted to leave.  IV taken out.  Pt left.  Steady gait.

## 2014-04-08 NOTE — ED Notes (Signed)
Patient transported to CT 

## 2014-04-08 NOTE — ED Provider Notes (Signed)
CSN: [id number]     Arrival date & time 04/08/14  1511 History   First MD Initiated Contact with Patient 04/08/14 1521     Chief Complaint  Patient presents with  . Fall  . Drug Problem     (Consider location/radiation/quality/duration/timing/severity/associated sxs/prior Treatment) HPI [name] is a 23 y.o. male who presents to ED with complaint of a fall. Pt states he was at a gas station, states he was getting out of his car when he dropped his phone. States he leaned over to pick it up, lost his balance and fell hitting his face and head on the ground. Pt reports that he did not have LOC. He states he is having a headache, and has multiple abrasions/lacerations to the face. Reports no nausea, vomiting, no changes in vision, no neck pain, no other injuries. Pt does admit to smoking marijuana this morning, and states he took 2 norco pills, states snorted one of them. Per EMS, pt with multiple abrasions to the face, and initially elevated HR of 150, and systolic BP >210. VS normal at the time of the evaluation.   Past Medical History  Diagnosis Date  . Heroin abuse   . ADHD (attention deficit hyperactivity disorder)   . Hepatitis C    No past surgical history on file. No family history on file. History  Substance Use Topics  . Smoking status: Current Every Day Smoker -- 1.00 packs/day for 5 years    Types: Cigarettes  . Smokeless tobacco: Never Used  . Alcohol Use: Yes     Comment: occasional use    Review of Systems  Constitutional: Negative for fever and chills.  HENT: Negative for ear discharge.   Respiratory: Negative for cough, chest tightness and shortness of breath.   Cardiovascular: Negative for chest pain, palpitations and leg swelling.  Gastrointestinal: Negative for nausea, vomiting, abdominal pain, diarrhea and abdominal distention.  Genitourinary: Negative for dysuria, urgency, frequency and hematuria.  Musculoskeletal: Negative for arthralgias, myalgias,  neck pain and neck stiffness.  Skin: Positive for wound. Negative for rash.  Allergic/Immunologic: Negative for immunocompromised state.  Neurological: Positive for headaches. Negative for dizziness, weakness, light-headedness and numbness.  Psychiatric/Behavioral: Negative for confusion, self-injury and agitation.      Allergies  Cefzil  Home Medications   Prior to Admission medications   Medication Sig Start Date End Date Taking? Authorizing Provider  lisdexamfetamine (VYVANSE) 70 MG capsule Take 70 mg by mouth daily.   Yes Historical Provider, MD  doxycycline (VIBRAMYCIN) 100 MG capsule Take 100 mg by mouth daily.    Historical Provider, MD   BP 142/80  Pulse 67  Temp(Src) 98.4 F (36.9 C) (Oral)  Resp 11  SpO2 94% Physical Exam  Nursing note and vitals reviewed. Constitutional: He is oriented to person, place, and time. He appears well-developed and well-nourished. No distress.  HENT:  Head: Normocephalic.  Right Ear: External ear normal.  Left Ear: External ear normal.  Nose: Nose normal.  Mouth/Throat: Oropharynx is clear and moist.  4x4cm abrasion to the right maxilla. 1cm laceration to the right eyebrow. Small <1cm laceration to the right upper frenulum. Teeth normal. TMs normal bilaterally with no hemotympanum.   Eyes: Conjunctivae and EOM are normal.  Pin point pupils, equal, round, reactive  Neck: Normal range of motion. Neck supple.  Cardiovascular: Normal rate, regular rhythm and normal heart sounds.   Pulmonary/Chest: Effort normal. No respiratory distress. He has no wheezes. He has no rales.  Abdominal: Soft. Bowel  sounds are normal. He exhibits no distension. There is no tenderness. There is no rebound.  Musculoskeletal: He exhibits no edema.  Neurological: He is alert and oriented to person, place, and time. No cranial nerve deficit. Coordination normal.  5/5 and equal upper and lower extremity strength bilaterally. Equal grip strength bilaterally. Normal  finger to nose and heel to shin. No pronator drift  Skin: Skin is warm and dry.    ED Course  Procedures (including critical care time) Labs Review Labs Reviewed - No data to display  Imaging Review No results found.   EKG Interpretation None      LACERATION REPAIR Performed by: Niyanna Asch A Kalanie Fewell Authorized by: Myriam Jacobsonatyana A Anderson Coppock Consent: Verbal consent obtained. Risks and benefits: risks, benefits and alternatives were discussed Consent given by: patient Patient identity confirmed: provided demographic data Prepped and Draped in normal sterile fashion Wound explored  Laceration Location: right eyebrow  Laceration Length: 1cm  No Foreign Bodies seen or palpated  Anesthesia: local infiltration  Local anesthetic: lidocaine 2% w epinephrine  Anesthetic total: 2 ml  Irrigation method: syringe Amount of cleaning: standard  Skin closure: porlene 6.0  Number of sutures: 2  Technique: simple interrupted  Patient tolerance: Patient tolerated the procedure well with no immediate complications.  MDM   Final diagnoses:  Head injury  Laceration of right eyebrow  Abrasion of face    Given patient is under the influence with head trauma, will get a CT head. Otherwise exam is unremarkable. Will monitor. His vital signs are normal right now. Laceration of the right eyebrow repaired. All wounds cleaned, bacitracin applied.    I was informed by nursing staff the patient left AMA, apparently he was on the phone and became very angry and stated he had to go right away. His CT scan was done but the reading is not back yet.    I reviewed CT scan, it is normal.   Filed Vitals:   04/08/14 1615  BP:   Pulse: 66  Temp:   Resp: 12     Lynne Takemoto A Saoirse Legere, PA-C 04/08/14 2005

## 2014-04-08 NOTE — ED Provider Notes (Signed)
Medical screening examination/treatment/procedure(s) were conducted as a shared visit with non-physician practitioner(s) and myself.  I personally evaluated the patient during the encounter.   EKG Interpretation   Date/Time:  Tuesday April 08 2014 15:12:31 EDT Ventricular Rate:  66 PR Interval:  113 QRS Duration: 110 QT Interval:  402 QTC Calculation: 421 R Axis:   83 Text Interpretation:  Sinus rhythm Borderline short PR interval RSR' in V1  or V2, probably normal variant Probable left ventricular hypertrophy No  significant change was found Confirmed by Seanne Chirico  MD, Tyeisha Dinan ([id number]) on  04/08/2014 3:58:44 PM      Well appearing. Laceration repaired  Lyanne CoKevin M Lavern Maslow, MD 04/08/14 2201

## 2014-04-08 NOTE — ED Notes (Signed)
Bed: RESA Expected date:  Expected time:  Means of arrival:  Comments: ms- 23 yo, fall, hit head? Tachy

## 2014-04-08 NOTE — ED Notes (Addendum)
Per EMS patient smoked marijuana, took 1.5 norco pill, snort 0.5 norco pill, was getting out of car, dropped his phone, reached to get it, and fell over. Laceration above right eye, abrasion below right nostril. Pinpoint pupils. Per EMS, on arrival HR 150, systolic BP > 210 mmHG.

## 2015-03-19 IMAGING — CT CT HEAD W/O CM
2 series · 17 of 30 positions shown, 20 images · non-contrast
Comparison: None.

CLINICAL DATA: Fall, head injury

EXAM:
CT HEAD WITHOUT CONTRAST
TECHNIQUE: Contiguous axial images were obtained from the base of the skull
through the vertex without intravenous contrast.

[Series 2: head w/o · axial · non-contrast · 0.47mm/px · z∈[-148,-33]mm · 9 of 29 slices shown, 12 images]
[im 3/29  brain]
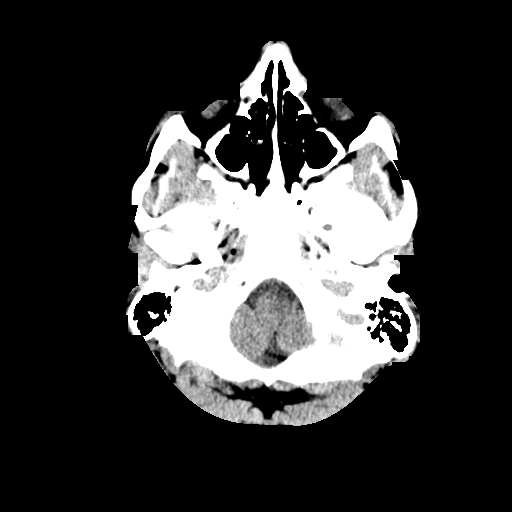
[im 3/29  bone]
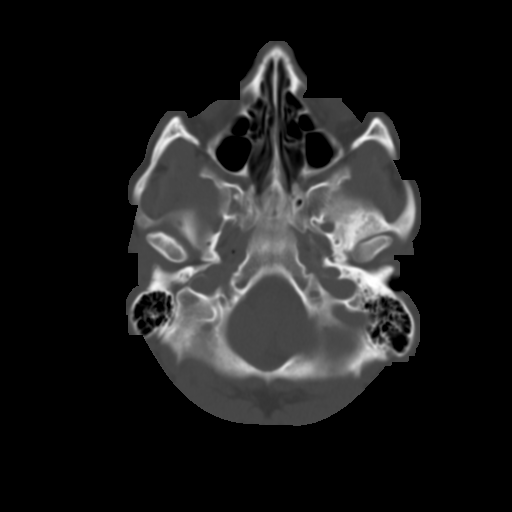
[im 6/29  brain]
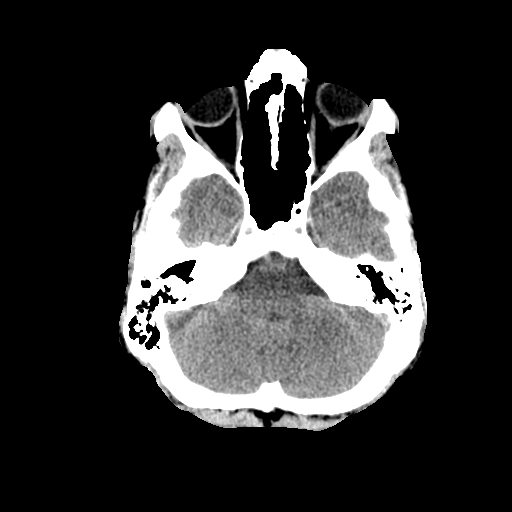
[im 9/29  brain]
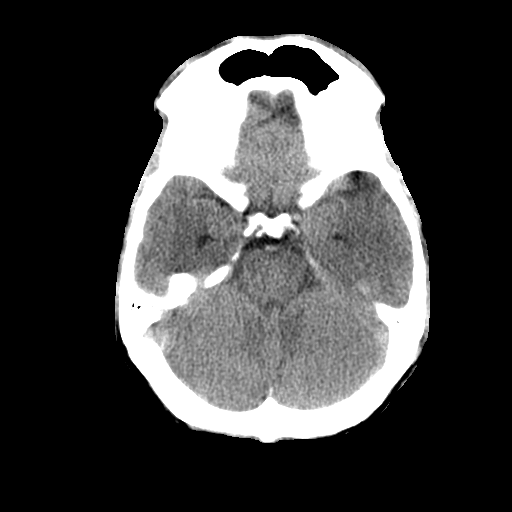
[im 12/29  brain]
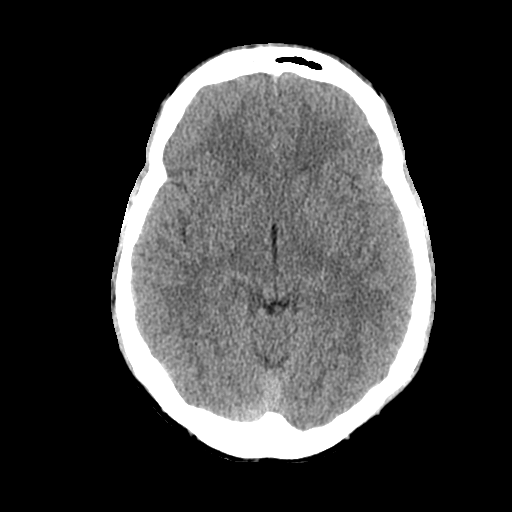
[im 15/29  brain]
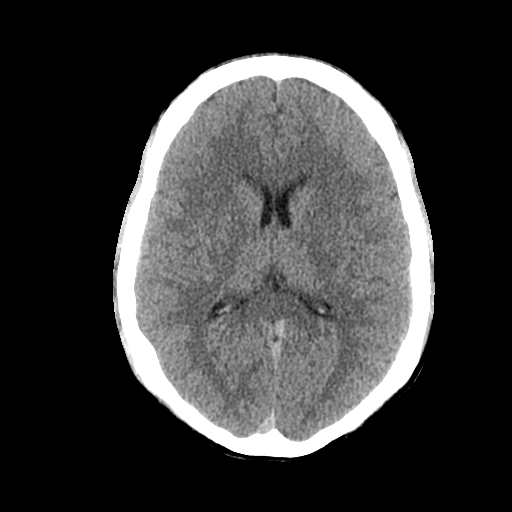
[im 15/29  bone]
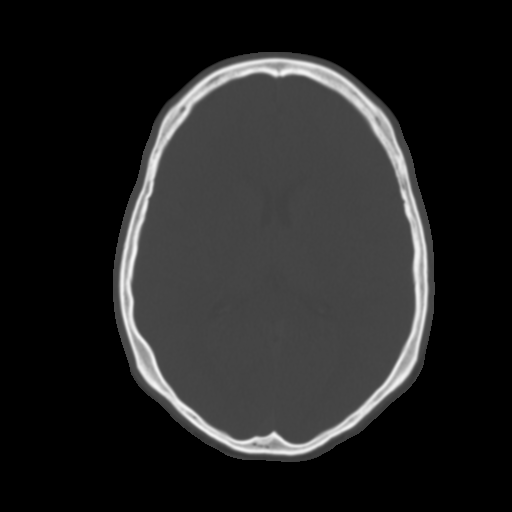
[im 17/29  brain]
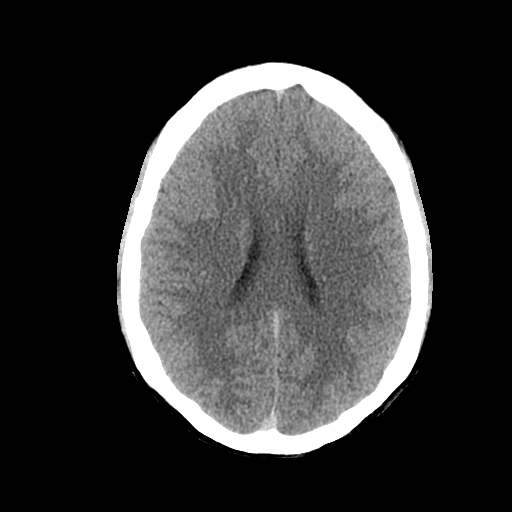
[im 20/29  brain]
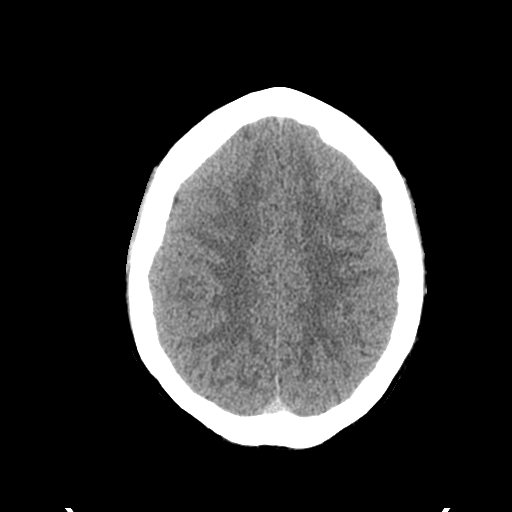
[im 23/29  brain]
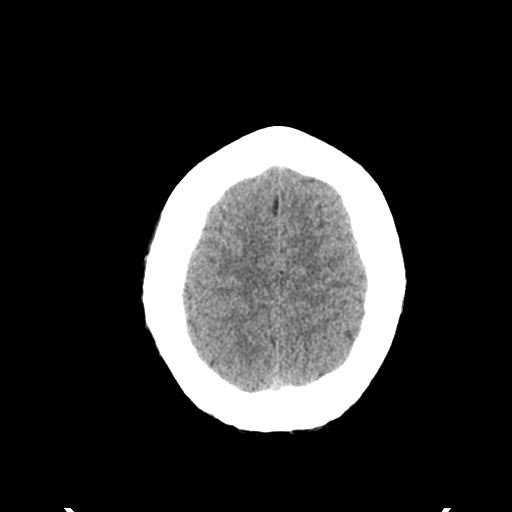
[im 26/29  brain]
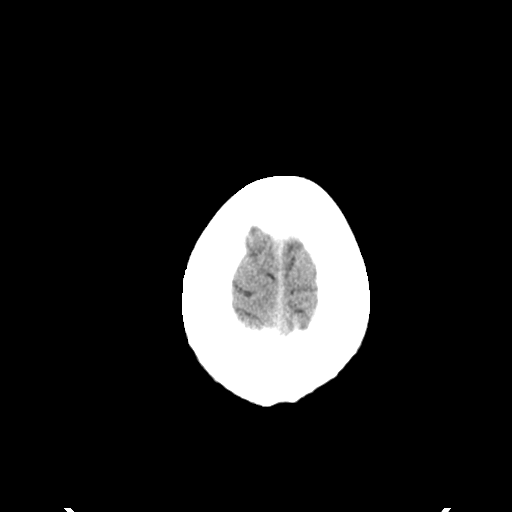
[im 26/29  bone]
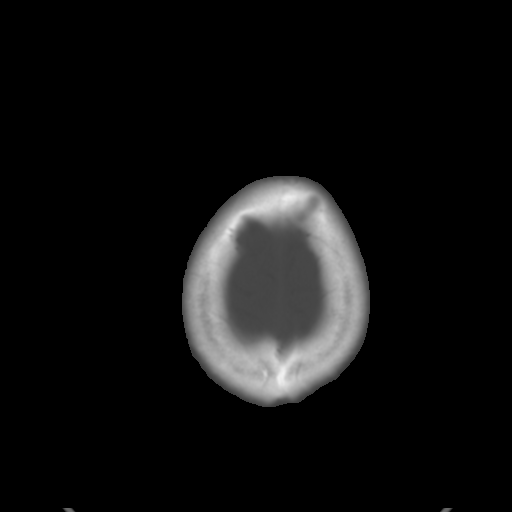

[Series 3: bone windows · axial · 0.47mm/px · z∈[-143,-32]mm · 8 of 49 slices shown]
[im 6/49  bone]
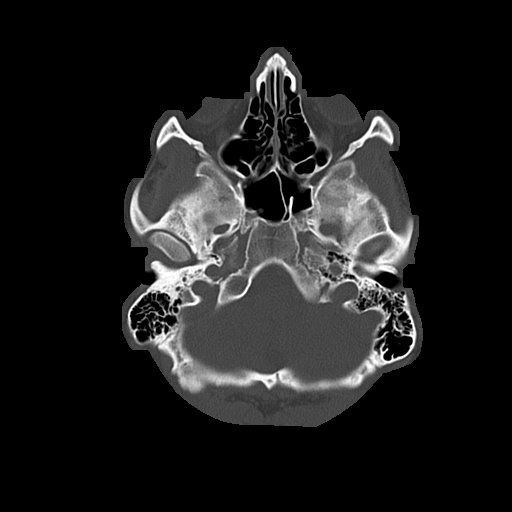
[im 11/49  bone]
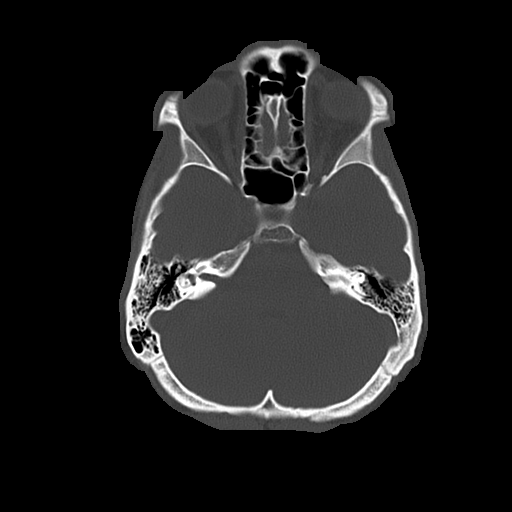
[im 17/49  bone]
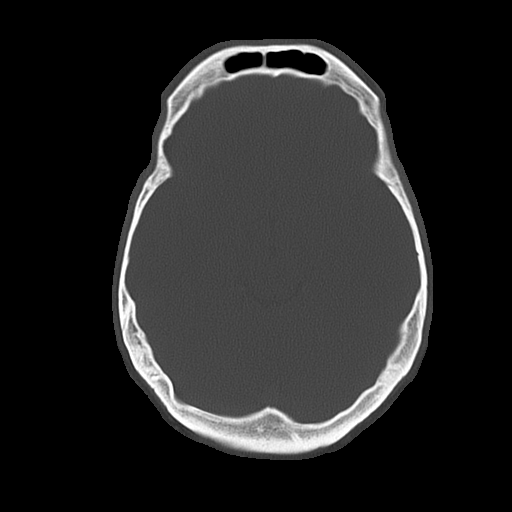
[im 22/49  bone]
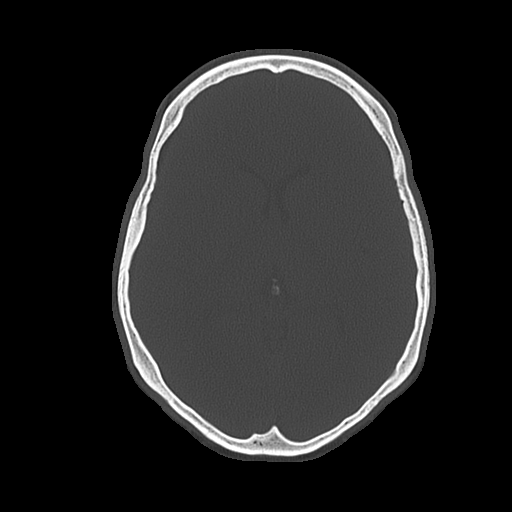
[im 27/49  bone]
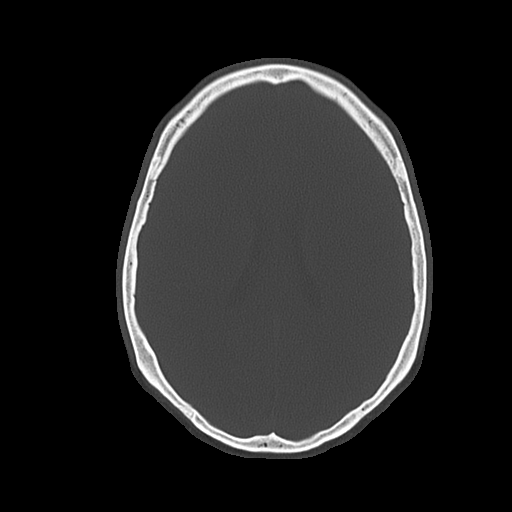
[im 33/49  bone]
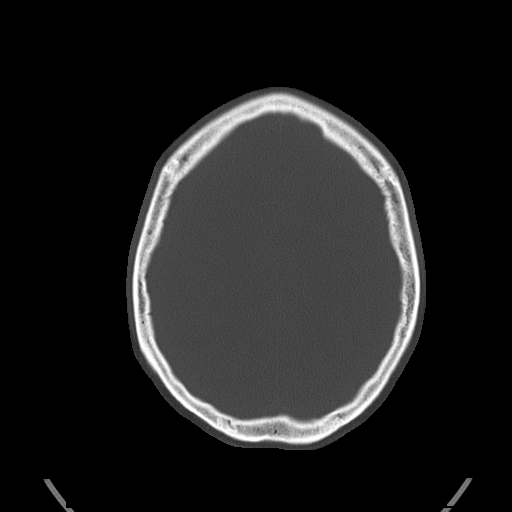
[im 38/49  bone]
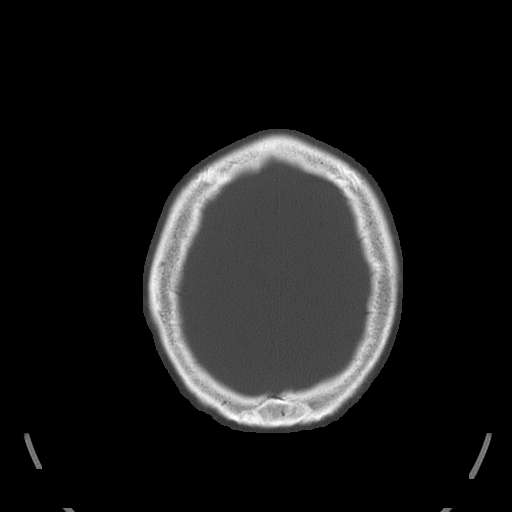
[im 43/49  bone]
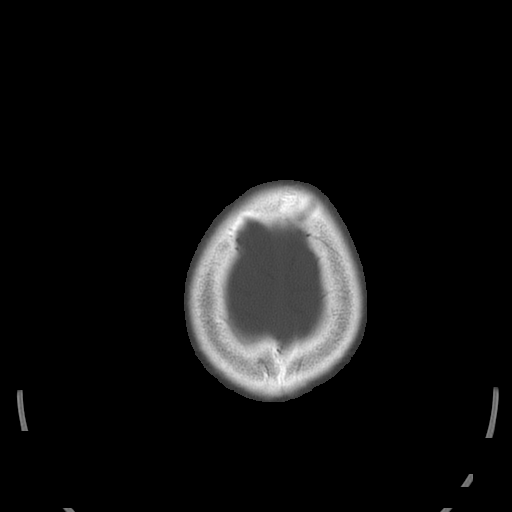

[17 of 30 positions shown; findings below may reference images not displayed]

FINDINGS: Ventricle size is normal. Negative for acute or chronic infarction.
Negative for hemorrhage or fluid collection. Negative for mass or
edema. No shift of the midline structures.

Calvarium is intact.
IMPRESSION: Normal

## 2015-07-26 ENCOUNTER — Encounter (HOSPITAL_COMMUNITY): Payer: Self-pay | Admitting: Emergency Medicine

## 2015-07-26 ENCOUNTER — Emergency Department (HOSPITAL_COMMUNITY): Payer: Medicaid Other

## 2015-07-26 ENCOUNTER — Emergency Department (HOSPITAL_COMMUNITY)
Admission: EM | Admit: 2015-07-26 | Discharge: 2015-07-26 | Discharge status: Against Medical Advice | Payer: Medicaid Other | Attending: Emergency Medicine | Admitting: Emergency Medicine

## 2015-07-26 DIAGNOSIS — R079 Chest pain, unspecified: Secondary | ICD-10-CM | POA: Insufficient documentation

## 2015-07-26 DIAGNOSIS — Z720 Tobacco use: Secondary | ICD-10-CM | POA: Insufficient documentation

## 2015-07-26 DIAGNOSIS — Z5321 Procedure and treatment not carried out due to patient leaving prior to being seen by health care provider: Secondary | ICD-10-CM

## 2015-07-26 LAB — I-STAT TROPONIN, ED: TROPONIN I, POC: 0.01 ng/mL (ref 0.00–0.08)

## 2015-07-26 LAB — BASIC METABOLIC PANEL
ANION GAP: 9 (ref 5–15)
BUN: 8 mg/dL (ref 6–20)
CALCIUM: 9.4 mg/dL (ref 8.9–10.3)
CHLORIDE: 102 mmol/L (ref 101–111)
CO2: 27 mmol/L (ref 22–32)
CREATININE: 0.91 mg/dL (ref 0.61–1.24)
GFR calc Af Amer: >60 mL/min (ref >60)
GFR calc non Af Amer: >60 mL/min (ref >60)
Glucose, Bld: 100 mg/dL — ABNORMAL HIGH (ref 65–99)
POTASSIUM: 4.1 mmol/L (ref 3.5–5.1)
Sodium: 138 mmol/L (ref 135–145)

## 2015-07-26 LAB — CBC
HEMATOCRIT: 45.3 % (ref 39.0–52.0)
HEMOGLOBIN: 15.6 g/dL (ref 13.0–17.0)
MCH: 29.3 pg (ref 26.0–34.0)
MCHC: 34.4 g/dL (ref 30.0–36.0)
MCV: 85.2 fL (ref 78.0–100.0)
Platelets: 226 10*3/uL (ref 150–400)
RBC: 5.32 MIL/uL (ref 4.22–5.81)
RDW: 13.3 % (ref 11.5–15.5)
WBC: 11.4 10*3/uL — ABNORMAL HIGH (ref 4.0–10.5)

## 2015-07-26 NOTE — ED Notes (Addendum)
Per pt co chest pain , worse with inspiration,  started last night , co also cough 2-3 days. Also co sob and dizziness. Pt reports was treated for right arm abscess. Pt alert and oriented x 4. Pt denies use of street drugs today.

## 2015-07-26 NOTE — ED Provider Notes (Addendum)
Reviewed labs and cxr.  As heading to see patient found the room empty.  Patient had left without being seen. cxr and ecg unremarkable.   1. Patient left without being seen     EKG Interpretation  Date/Time:  Sunday July 26 2015 11:53:17 EDT Ventricular Rate:  90 PR Interval:  116 QRS Duration: 104 QT Interval:  352 QTC Calculation: 431 R Axis:   90 Text Interpretation:  Sinus rhythm Borderline short PR interval Consider right ventricular hypertrophy ST elevation, consider anterior injury No significant change since last tracing Confirmed by ZACKOWSKI  MD, SCOTT ([id number]) on 07/26/2015 12:18:26 PM        Melene Plan, DO 07/26/15 1842  Melene Plan, DO 07/26/15 1842

## 2015-07-26 NOTE — ED Notes (Signed)
Pt reporting he wants to leave. Discussed with pt and MD. Informed pt that MD would be in to see him soon. Reports he does not want to wait. Walked out.

## 2016-07-05 IMAGING — CR DG CHEST 2V
2 series · 2 of 2 positions shown · non-contrast
Comparison: None.

CLINICAL DATA: Short of breath since last night. Positive smoking
history. No cardiovascular history.

EXAM:
CHEST  2 VIEW

[w chest pa]
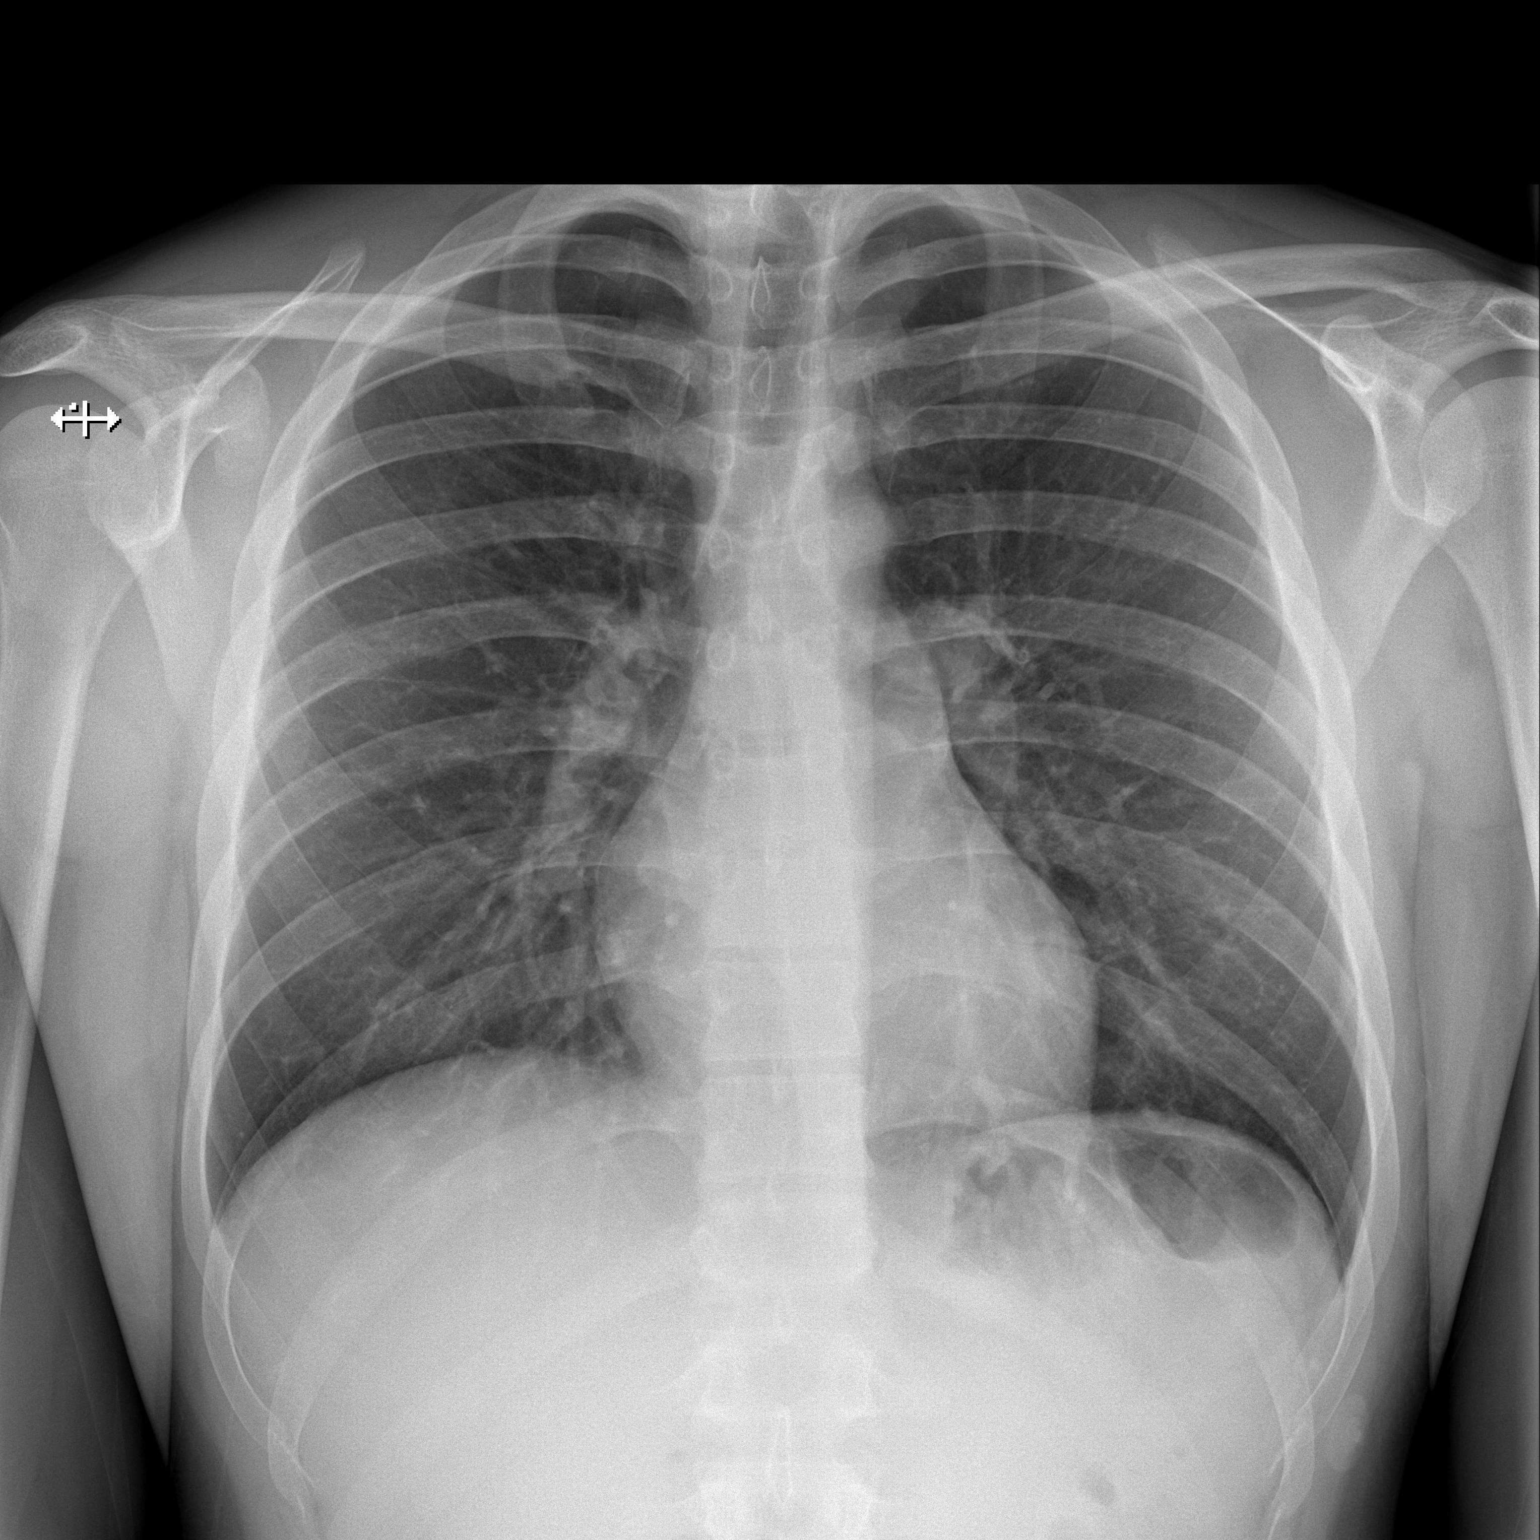

[w chest lat]
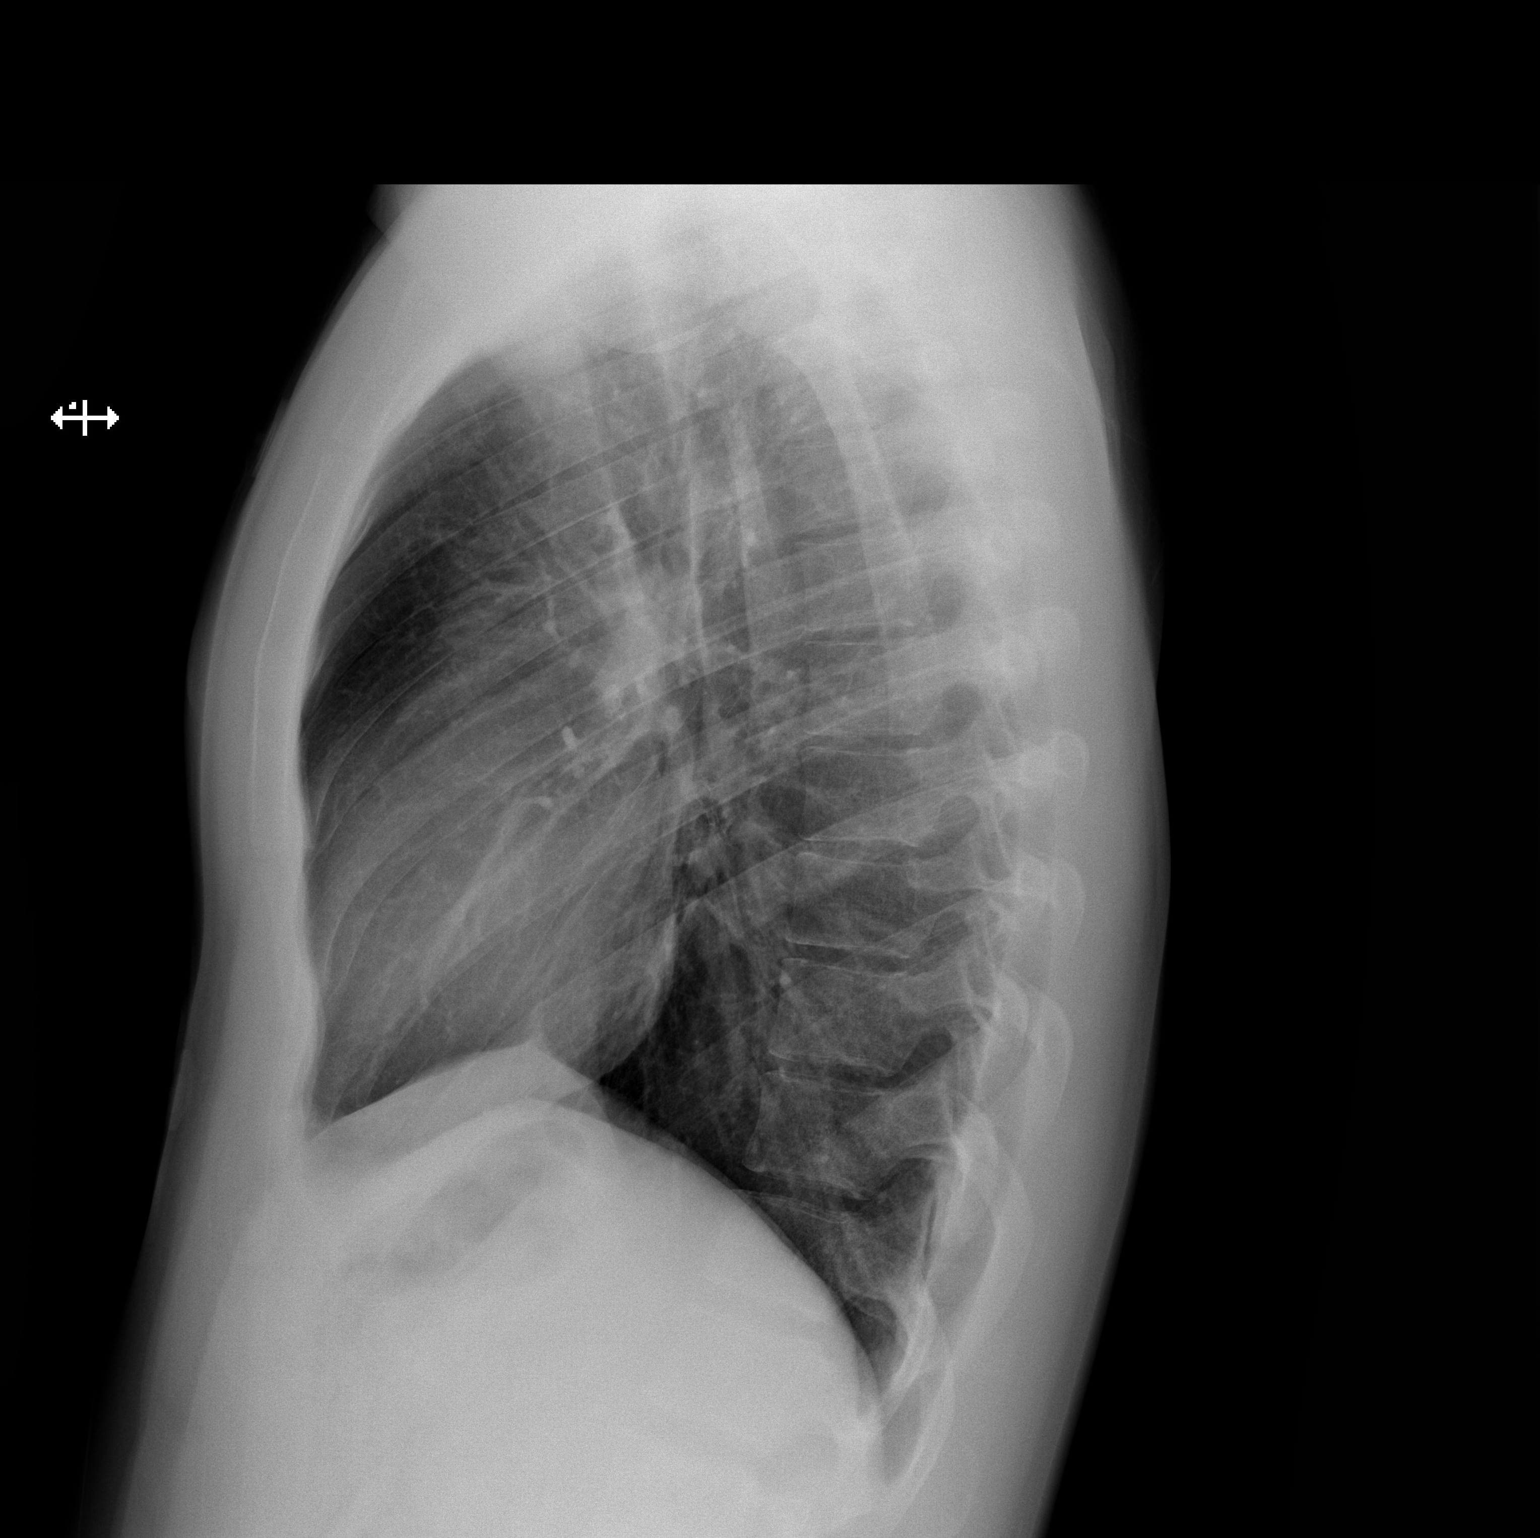

[2 of 2 positions shown; findings below may reference images not displayed]

FINDINGS: The heart size and mediastinal contours are within normal limits.
Both lungs are clear. No pleural effusion or pneumothorax. The
visualized skeletal structures are unremarkable.
IMPRESSION: No active cardiopulmonary disease.

## 2017-02-26 ENCOUNTER — Encounter (HOSPITAL_COMMUNITY): Payer: Self-pay | Admitting: Emergency Medicine

## 2017-02-26 ENCOUNTER — Emergency Department (HOSPITAL_COMMUNITY)
Admission: EM | Admit: 2017-02-26 | Discharge: 2017-02-26 | Disposition: A | Discharge status: Home / Self Care | Payer: Medicaid Other | Attending: Emergency Medicine | Admitting: Emergency Medicine

## 2017-02-26 DIAGNOSIS — Y929 Unspecified place or not applicable: Secondary | ICD-10-CM | POA: Insufficient documentation

## 2017-02-26 DIAGNOSIS — W01198A Fall on same level from slipping, tripping and stumbling with subsequent striking against other object, initial encounter: Secondary | ICD-10-CM | POA: Insufficient documentation

## 2017-02-26 DIAGNOSIS — Y939 Activity, unspecified: Secondary | ICD-10-CM | POA: Insufficient documentation

## 2017-02-26 DIAGNOSIS — S0101XA Laceration without foreign body of scalp, initial encounter: Secondary | ICD-10-CM | POA: Insufficient documentation

## 2017-02-26 DIAGNOSIS — F17210 Nicotine dependence, cigarettes, uncomplicated: Secondary | ICD-10-CM | POA: Insufficient documentation

## 2017-02-26 DIAGNOSIS — Y999 Unspecified external cause status: Secondary | ICD-10-CM | POA: Insufficient documentation

## 2017-02-26 DIAGNOSIS — F909 Attention-deficit hyperactivity disorder, unspecified type: Secondary | ICD-10-CM | POA: Insufficient documentation

## 2017-02-26 DIAGNOSIS — Z79899 Other long term (current) drug therapy: Secondary | ICD-10-CM | POA: Insufficient documentation

## 2017-02-26 NOTE — ED Triage Notes (Signed)
Pt has aprox 1 inch laceration to back of right head from last night. Pt reports he was drunk and tripped and hit head on corner of concrete. No LOC. Pt ambulatory. C/o headache.

## 2017-02-26 NOTE — ED Provider Notes (Signed)
[facility]-EMERGENCY DEPT Provider Note   CSN: [id number] Arrival date & time: 02/26/17  1236   By signing my name below, I, [name], attest that this documentation has been prepared under the direction and in the presence of non-physician practitioner, Ok EdwardsLeslie Karen Erlinda Solinger, PA-C. Electronically Signed: [name], Scribe. 02/26/2017. 1:12 PM.  History   Chief Complaint Chief Complaint  Patient presents with  . Head Laceration   The history is provided by the patient. No language interpreter was used.    HPI Comments:  [name] is a 26 y.o. male with pmhx of hepatitis c who presents to the Emergency Department complaining of constant, mild headache s/p fall occurring last night. Pt states that he was drinking when he fell and hit his head on some steps. He sustained a small 1cm wound to his back right occipital region during his fall. No modifying factors indicated. Pt denies any LOC, weakness or numbness.    Past Medical History:  Diagnosis Date  . ADHD (attention deficit hyperactivity disorder)   . Hepatitis C   . Heroin abuse     Patient Active Problem List   Diagnosis Date Noted  . Hepatitis C antibody test positive 04/12/2013  . Heroin overdose 04/11/2013  . Leukocytosis 04/11/2013    History reviewed. No pertinent surgical history.     Home Medications    Prior to Admission medications   Medication Sig Start Date End Date Taking? Authorizing Provider  sulfamethoxazole-trimethoprim (BACTRIM DS,SEPTRA DS) 800-160 MG per tablet Take 1 tablet by mouth 2 (two) times daily.    Historical Provider, MD  traMADol (ULTRAM) 50 MG tablet Take 50 mg by mouth every 6 (six) hours as needed for moderate pain or severe pain.    Historical Provider, MD    Family History No family history on file.  Social History Social History  Substance Use Topics  . Smoking status: Current Every Day Smoker    Packs/day: 1.00    Years: 5.00    Types: Cigarettes  . Smokeless  tobacco: Never Used  . Alcohol use Yes     Comment: occasional use     Allergies   Cefzil [cefprozil]   Review of Systems Review of Systems  Skin: Positive for wound.  Neurological: Positive for headaches. Negative for syncope, weakness and numbness.     Physical Exam Updated Vital Signs BP 139/78 (BP Location: Left Arm)   Pulse 91   Temp 98.2 F (36.8 C) (Oral)   Resp 18   Ht 5\' 10"  (1.778 m)   Wt 165 lb (74.8 kg)   SpO2 97%   BMI 23.68 kg/m   Physical Exam  Constitutional: He is oriented to person, place, and time. He appears well-developed and well-nourished. No distress.  HENT:  Head: Normocephalic and atraumatic.  Eyes: Conjunctivae are normal.  Cardiovascular: Normal rate.   Pulmonary/Chest: Effort normal.  Abdominal: He exhibits no distension.  Neurological: He is alert and oriented to person, place, and time.  Skin: Skin is warm and dry.  1cm laceration to right occipital  Psychiatric: He has a normal mood and affect.  Nursing note and vitals reviewed.    ED Treatments / Results  DIAGNOSTIC STUDIES:  Oxygen Saturation is 97% on RA, normal by my interpretation.    COORDINATION OF CARE:  2:21 PM Discussed treatment plan with pt at bedside and pt agreed to plan.  Labs (all labs ordered are listed, but only abnormal results are displayed) Labs Reviewed - No data to  display  EKG  EKG Interpretation None       Radiology No results found.  Procedures Procedures (including critical care time)  Medications Ordered in ED Medications - No data to display   Initial Impression / Assessment and Plan / ED Course  I have reviewed the triage vital signs and the nursing notes.  Pertinent labs & imaging results that were available during my care of the patient were reviewed by me and considered in my medical decision making (see chart for details).       Final Clinical Impressions(s) / ED Diagnoses   Final diagnoses:  Laceration of scalp,  initial encounter    New Prescriptions Discharge Medication List as of 02/26/2017  2:35 PM    An After Visit Summary was printed and given to the patient.  I personally performed the services in this documentation, which was scribed in my presence.  The recorded information has been reviewed and considered.   Barnet Pall.     Lonia Skinner [place], PA-C 02/26/17 1627    Cathren Laine, MD 03/01/17 [phone]

## 2017-02-26 NOTE — ED Notes (Signed)
Patient left room before nurse gave him discharge papers and went over his instructions.

## 2017-02-26 NOTE — Discharge Instructions (Signed)
Return if any problems.

## 2021-11-04 ENCOUNTER — Emergency Department (HOSPITAL_COMMUNITY): Payer: No Typology Code available for payment source

## 2021-11-04 ENCOUNTER — Encounter (HOSPITAL_COMMUNITY): Admission: EM | Disposition: A | Discharge status: Home / Self Care | Payer: Self-pay | Source: Home / Self Care

## 2021-11-04 ENCOUNTER — Inpatient Hospital Stay (HOSPITAL_COMMUNITY): Payer: No Typology Code available for payment source | Admitting: Certified Registered Nurse Anesthetist

## 2021-11-04 ENCOUNTER — Inpatient Hospital Stay (HOSPITAL_COMMUNITY): Payer: No Typology Code available for payment source

## 2021-11-04 ENCOUNTER — Inpatient Hospital Stay (HOSPITAL_COMMUNITY)
Admission: EM | Admit: 2021-11-04 | Discharge: 2021-11-15 | DRG: 956 | Disposition: A | Discharge status: Home / Self Care | Payer: No Typology Code available for payment source | Attending: Surgery | Admitting: Surgery

## 2021-11-04 ENCOUNTER — Encounter (HOSPITAL_COMMUNITY): Payer: Self-pay | Admitting: General Surgery

## 2021-11-04 DIAGNOSIS — S0181XA Laceration without foreign body of other part of head, initial encounter: Secondary | ICD-10-CM | POA: Diagnosis present

## 2021-11-04 DIAGNOSIS — S82401A Unspecified fracture of shaft of right fibula, initial encounter for closed fracture: Secondary | ICD-10-CM | POA: Diagnosis present

## 2021-11-04 DIAGNOSIS — Z888 Allergy status to other drugs, medicaments and biological substances status: Secondary | ICD-10-CM | POA: Diagnosis not present

## 2021-11-04 DIAGNOSIS — S82201B Unspecified fracture of shaft of right tibia, initial encounter for open fracture type I or II: Secondary | ICD-10-CM | POA: Diagnosis present

## 2021-11-04 DIAGNOSIS — Z20822 Contact with and (suspected) exposure to covid-19: Secondary | ICD-10-CM | POA: Diagnosis present

## 2021-11-04 DIAGNOSIS — Y900 Blood alcohol level of less than 20 mg/100 ml: Secondary | ICD-10-CM | POA: Diagnosis present

## 2021-11-04 DIAGNOSIS — S72401B Unspecified fracture of lower end of right femur, initial encounter for open fracture type I or II: Secondary | ICD-10-CM | POA: Diagnosis present

## 2021-11-04 DIAGNOSIS — S32592A Other specified fracture of left pubis, initial encounter for closed fracture: Secondary | ICD-10-CM | POA: Diagnosis present

## 2021-11-04 DIAGNOSIS — B1920 Unspecified viral hepatitis C without hepatic coma: Secondary | ICD-10-CM | POA: Diagnosis present

## 2021-11-04 DIAGNOSIS — Y92410 Unspecified street and highway as the place of occurrence of the external cause: Secondary | ICD-10-CM

## 2021-11-04 DIAGNOSIS — Z419 Encounter for procedure for purposes other than remedying health state, unspecified: Secondary | ICD-10-CM

## 2021-11-04 DIAGNOSIS — F1010 Alcohol abuse, uncomplicated: Secondary | ICD-10-CM | POA: Diagnosis present

## 2021-11-04 DIAGNOSIS — Z23 Encounter for immunization: Secondary | ICD-10-CM | POA: Diagnosis not present

## 2021-11-04 DIAGNOSIS — S32591A Other specified fracture of right pubis, initial encounter for closed fracture: Secondary | ICD-10-CM | POA: Diagnosis present

## 2021-11-04 DIAGNOSIS — S12400A Unspecified displaced fracture of fifth cervical vertebra, initial encounter for closed fracture: Secondary | ICD-10-CM | POA: Diagnosis present

## 2021-11-04 DIAGNOSIS — R339 Retention of urine, unspecified: Secondary | ICD-10-CM | POA: Diagnosis present

## 2021-11-04 DIAGNOSIS — F1123 Opioid dependence with withdrawal: Secondary | ICD-10-CM | POA: Diagnosis present

## 2021-11-04 DIAGNOSIS — T148XXA Other injury of unspecified body region, initial encounter: Secondary | ICD-10-CM

## 2021-11-04 DIAGNOSIS — S01511A Laceration without foreign body of lip, initial encounter: Secondary | ICD-10-CM | POA: Diagnosis present

## 2021-11-04 DIAGNOSIS — F909 Attention-deficit hyperactivity disorder, unspecified type: Secondary | ICD-10-CM | POA: Diagnosis present

## 2021-11-04 DIAGNOSIS — D62 Acute posthemorrhagic anemia: Secondary | ICD-10-CM | POA: Diagnosis not present

## 2021-11-04 DIAGNOSIS — R52 Pain, unspecified: Secondary | ICD-10-CM

## 2021-11-04 DIAGNOSIS — S82112A Displaced fracture of left tibial spine, initial encounter for closed fracture: Secondary | ICD-10-CM | POA: Diagnosis present

## 2021-11-04 DIAGNOSIS — F17210 Nicotine dependence, cigarettes, uncomplicated: Secondary | ICD-10-CM | POA: Diagnosis present

## 2021-11-04 DIAGNOSIS — S12500A Unspecified displaced fracture of sixth cervical vertebra, initial encounter for closed fracture: Secondary | ICD-10-CM | POA: Diagnosis present

## 2021-11-04 DIAGNOSIS — S36115A Moderate laceration of liver, initial encounter: Secondary | ICD-10-CM | POA: Diagnosis present

## 2021-11-04 DIAGNOSIS — S32119A Unspecified Zone I fracture of sacrum, initial encounter for closed fracture: Secondary | ICD-10-CM | POA: Diagnosis present

## 2021-11-04 DIAGNOSIS — S82142A Displaced bicondylar fracture of left tibia, initial encounter for closed fracture: Secondary | ICD-10-CM | POA: Diagnosis present

## 2021-11-04 DIAGNOSIS — T1490XA Injury, unspecified, initial encounter: Secondary | ICD-10-CM | POA: Diagnosis present

## 2021-11-04 HISTORY — PX: ORIF TIBIA PLATEAU: SHX2132

## 2021-11-04 HISTORY — PX: SACRO-ILIAC PINNING: SHX5050

## 2021-11-04 HISTORY — PX: FACIAL LACERATION REPAIR: SHX6589

## 2021-11-04 HISTORY — PX: FEMUR IM NAIL: SHX1597

## 2021-11-04 HISTORY — PX: TIBIA IM NAIL INSERTION: SHX2516

## 2021-11-04 LAB — COMPREHENSIVE METABOLIC PANEL
ALT: 74 U/L — ABNORMAL HIGH (ref 0–44)
AST: 130 U/L — ABNORMAL HIGH (ref 15–41)
Albumin: 3.5 g/dL (ref 3.5–5.0)
Alkaline Phosphatase: 61 U/L (ref 38–126)
Anion gap: 10 (ref 5–15)
BUN: 12 mg/dL (ref 6–20)
CO2: 24 mmol/L (ref 22–32)
Calcium: 9 mg/dL (ref 8.9–10.3)
Chloride: 103 mmol/L (ref 98–111)
Creatinine, Ser: 1.12 mg/dL (ref 0.61–1.24)
GFR, Estimated: >60 mL/min (ref >60)
Glucose, Bld: 177 mg/dL — ABNORMAL HIGH (ref 70–99)
Potassium: 3.7 mmol/L (ref 3.5–5.1)
Sodium: 137 mmol/L (ref 135–145)
Total Bilirubin: 0.7 mg/dL (ref 0.3–1.2)
Total Protein: 7.3 g/dL (ref 6.5–8.1)

## 2021-11-04 LAB — I-STAT CHEM 8, ED
BUN: 13 mg/dL (ref 6–20)
Calcium, Ion: 1.15 mmol/L (ref 1.15–1.40)
Chloride: 103 mmol/L (ref 98–111)
Creatinine, Ser: 1 mg/dL (ref 0.61–1.24)
Glucose, Bld: 169 mg/dL — ABNORMAL HIGH (ref 70–99)
HCT: 42 % (ref 39.0–52.0)
Hemoglobin: 14.3 g/dL (ref 13.0–17.0)
Potassium: 3.8 mmol/L (ref 3.5–5.1)
Sodium: 141 mmol/L (ref 135–145)
TCO2: 24 mmol/L (ref 22–32)

## 2021-11-04 LAB — RESP PANEL BY RT-PCR (FLU A&B, COVID) ARPGX2
Influenza A by PCR: NEGATIVE
Influenza B by PCR: NEGATIVE
SARS Coronavirus 2 by RT PCR: NEGATIVE

## 2021-11-04 LAB — LACTIC ACID, PLASMA: Lactic Acid, Venous: 2.8 mmol/L (ref 0.5–1.9)

## 2021-11-04 LAB — PROTIME-INR
INR: 1.1 (ref 0.8–1.2)
Prothrombin Time: 14.5 seconds (ref 11.4–15.2)

## 2021-11-04 LAB — CBC
HCT: 41.5 % (ref 39.0–52.0)
Hemoglobin: 13.9 g/dL (ref 13.0–17.0)
MCH: 29.4 pg (ref 26.0–34.0)
MCHC: 33.5 g/dL (ref 30.0–36.0)
MCV: 87.9 fL (ref 80.0–100.0)
Platelets: 326 10*3/uL (ref 150–400)
RBC: 4.72 MIL/uL (ref 4.22–5.81)
RDW: 12.7 % (ref 11.5–15.5)
WBC: 23.8 10*3/uL — ABNORMAL HIGH (ref 4.0–10.5)
nRBC: 0 % (ref 0.0–0.2)

## 2021-11-04 LAB — SAMPLE TO BLOOD BANK

## 2021-11-04 LAB — ETHANOL: Alcohol, Ethyl (B): <10 mg/dL (ref <10)

## 2021-11-04 SURGERY — INSERTION, INTRAMEDULLARY ROD, FEMUR, RETROGRADE
Anesthesia: General | Site: Pelvis | Laterality: Right

## 2021-11-04 MED ORDER — PROPOFOL 10 MG/ML IV BOLUS
INTRAVENOUS | Status: AC
  Filled 2021-11-04: qty 40

## 2021-11-04 MED ORDER — CEFAZOLIN SODIUM-DEXTROSE 2-4 GM/100ML-% IV SOLN
INTRAVENOUS | Status: AC
  Filled 2021-11-04: qty 100

## 2021-11-04 MED ORDER — FENTANYL CITRATE (PF) 100 MCG/2ML IJ SOLN
INTRAMUSCULAR | Status: AC
  Administered 2021-11-04: 15:00:00 50 ug
  Filled 2021-11-04: qty 2

## 2021-11-04 MED ORDER — MIDAZOLAM HCL 5 MG/5ML IJ SOLN
INTRAMUSCULAR | Status: DC | PRN
  Administered 2021-11-04: 2 mg via INTRAVENOUS

## 2021-11-04 MED ORDER — TETANUS-DIPHTH-ACELL PERTUSSIS 5-2.5-18.5 LF-MCG/0.5 IM SUSY
0.5000 mL | PREFILLED_SYRINGE | Freq: Once | INTRAMUSCULAR | Status: AC
  Administered 2021-11-04: 11:00:00 0.5 mL via INTRAMUSCULAR

## 2021-11-04 MED ORDER — FENTANYL CITRATE (PF) 100 MCG/2ML IJ SOLN: 100.0000 ug | Freq: Once | INTRAMUSCULAR | Status: AC

## 2021-11-04 MED ORDER — SODIUM CHLORIDE 0.9 % IR SOLN
Status: DC | PRN
  Administered 2021-11-04 (×2): 3000 mL

## 2021-11-04 MED ORDER — HYDROMORPHONE HCL 1 MG/ML IJ SOLN
INTRAMUSCULAR | Status: AC
  Filled 2021-11-04: qty 1

## 2021-11-04 MED ORDER — LACTATED RINGERS IV SOLN: INTRAVENOUS | Status: DC | PRN

## 2021-11-04 MED ORDER — PHENYLEPHRINE 40 MCG/ML (10ML) SYRINGE FOR IV PUSH (FOR BLOOD PRESSURE SUPPORT)
PREFILLED_SYRINGE | INTRAVENOUS | Status: AC
  Filled 2021-11-04: qty 10

## 2021-11-04 MED ORDER — FENTANYL CITRATE (PF) 100 MCG/2ML IJ SOLN
INTRAMUSCULAR | Status: AC
  Administered 2021-11-04: 14:00:00 100 ug via INTRAVENOUS
  Filled 2021-11-04: qty 2

## 2021-11-04 MED ORDER — METHOCARBAMOL 500 MG PO TABS
1000.0000 mg | ORAL_TABLET | Freq: Three times a day (TID) | ORAL | Status: DC
  Administered 2021-11-04 – 2021-11-10 (×17): 1000 mg via ORAL
  Filled 2021-11-04 (×18): qty 2

## 2021-11-04 MED ORDER — KETAMINE HCL 50 MG/5ML IJ SOSY
PREFILLED_SYRINGE | INTRAMUSCULAR | Status: AC
  Filled 2021-11-04: qty 5

## 2021-11-04 MED ORDER — LIDOCAINE 2% (20 MG/ML) 5 ML SYRINGE
INTRAMUSCULAR | Status: AC
  Filled 2021-11-04: qty 5

## 2021-11-04 MED ORDER — HYDROMORPHONE HCL 1 MG/ML IJ SOLN
INTRAMUSCULAR | Status: DC | PRN
  Administered 2021-11-04: .5 mg via INTRAVENOUS

## 2021-11-04 MED ORDER — CEFAZOLIN SODIUM-DEXTROSE 1-4 GM/50ML-% IV SOLN
INTRAVENOUS | Status: AC | PRN
  Administered 2021-11-04: 2 g via INTRAVENOUS

## 2021-11-04 MED ORDER — ONDANSETRON HCL 4 MG/2ML IJ SOLN
INTRAMUSCULAR | Status: AC
  Filled 2021-11-04: qty 2

## 2021-11-04 MED ORDER — AMISULPRIDE (ANTIEMETIC) 5 MG/2ML IV SOLN: 10.0000 mg | Freq: Once | INTRAVENOUS | Status: DC | PRN

## 2021-11-04 MED ORDER — 0.9 % SODIUM CHLORIDE (POUR BTL) OPTIME
TOPICAL | Status: DC | PRN
  Administered 2021-11-04 (×2): 1000 mL

## 2021-11-04 MED ORDER — HYDROMORPHONE HCL 1 MG/ML IJ SOLN
0.5000 mg | INTRAMUSCULAR | Status: DC | PRN
  Administered 2021-11-05 (×4): 2 mg via INTRAVENOUS
  Filled 2021-11-04 (×4): qty 2

## 2021-11-04 MED ORDER — OXYCODONE HCL 5 MG/5ML PO SOLN
ORAL | Status: AC
  Administered 2021-11-04: 22:00:00 5 mg
  Filled 2021-11-04: qty 5

## 2021-11-04 MED ORDER — OXYCODONE HCL 5 MG/5ML PO SOLN
5.0000 mg | Freq: Once | ORAL | Status: DC | PRN
  Administered 2021-11-04: 22:00:00 5 mg via ORAL

## 2021-11-04 MED ORDER — ONDANSETRON HCL 4 MG/2ML IJ SOLN
4.0000 mg | Freq: Four times a day (QID) | INTRAMUSCULAR | Status: DC | PRN
  Administered 2021-11-14: 05:00:00 4 mg via INTRAVENOUS
  Filled 2021-11-04: qty 2

## 2021-11-04 MED ORDER — CEFAZOLIN SODIUM-DEXTROSE 2-4 GM/100ML-% IV SOLN: 2.0000 g | Freq: Once | INTRAVENOUS | Status: DC

## 2021-11-04 MED ORDER — POLYETHYLENE GLYCOL 3350 17 G PO PACK
17.0000 g | PACK | Freq: Every day | ORAL | Status: DC
  Administered 2021-11-05: 17 g via ORAL
  Filled 2021-11-04 (×5): qty 1

## 2021-11-04 MED ORDER — OXYCODONE HCL 5 MG PO TABS: 5.0000 mg | ORAL_TABLET | Freq: Once | ORAL | Status: DC | PRN

## 2021-11-04 MED ORDER — IOHEXOL 350 MG/ML SOLN
100.0000 mL | Freq: Once | INTRAVENOUS | Status: AC | PRN
  Administered 2021-11-04: 12:00:00 100 mL via INTRAVENOUS

## 2021-11-04 MED ORDER — ACETAMINOPHEN 500 MG PO TABS
1000.0000 mg | ORAL_TABLET | Freq: Four times a day (QID) | ORAL | Status: DC
  Administered 2021-11-04 – 2021-11-15 (×39): 1000 mg via ORAL
  Filled 2021-11-04 (×42): qty 2

## 2021-11-04 MED ORDER — HYDROMORPHONE HCL 1 MG/ML IJ SOLN
0.2500 mg | INTRAMUSCULAR | Status: DC | PRN
  Administered 2021-11-04 (×4): 0.5 mg via INTRAVENOUS

## 2021-11-04 MED ORDER — KETAMINE HCL 10 MG/ML IJ SOLN
INTRAMUSCULAR | Status: DC | PRN
  Administered 2021-11-04 (×4): 10 mg via INTRAVENOUS
  Administered 2021-11-04: 30 mg via INTRAVENOUS
  Administered 2021-11-04: 20 mg via INTRAVENOUS

## 2021-11-04 MED ORDER — PROMETHAZINE HCL 25 MG/ML IJ SOLN: 6.2500 mg | INTRAMUSCULAR | Status: DC | PRN

## 2021-11-04 MED ORDER — MEPERIDINE HCL 25 MG/ML IJ SOLN
6.2500 mg | INTRAMUSCULAR | Status: DC | PRN
Start: 2021-11-04 — End: 2021-11-04

## 2021-11-04 MED ORDER — KCL IN DEXTROSE-NACL 20-5-0.45 MEQ/L-%-% IV SOLN
INTRAVENOUS | Status: DC
  Filled 2021-11-04 (×3): qty 1000

## 2021-11-04 MED ORDER — FENTANYL CITRATE (PF) 100 MCG/2ML IJ SOLN
INTRAMUSCULAR | Status: DC | PRN
  Administered 2021-11-04: 50 ug via INTRAVENOUS
  Administered 2021-11-04: 250 ug via INTRAVENOUS
  Administered 2021-11-04: 50 ug via INTRAVENOUS
  Administered 2021-11-04: 100 ug via INTRAVENOUS
  Administered 2021-11-04: 50 ug via INTRAVENOUS

## 2021-11-04 MED ORDER — ROCURONIUM BROMIDE 10 MG/ML (PF) SYRINGE
PREFILLED_SYRINGE | INTRAVENOUS | Status: DC | PRN
  Administered 2021-11-04: 10 mg via INTRAVENOUS
  Administered 2021-11-04: 20 mg via INTRAVENOUS
  Administered 2021-11-04: 30 mg via INTRAVENOUS
  Administered 2021-11-04: 70 mg via INTRAVENOUS
  Administered 2021-11-04: 30 mg via INTRAVENOUS

## 2021-11-04 MED ORDER — FENTANYL CITRATE (PF) 250 MCG/5ML IJ SOLN
INTRAMUSCULAR | Status: AC
  Filled 2021-11-04: qty 5

## 2021-11-04 MED ORDER — PROPOFOL 10 MG/ML IV BOLUS
INTRAVENOUS | Status: DC | PRN
  Administered 2021-11-04: 150 mg via INTRAVENOUS

## 2021-11-04 MED ORDER — FENTANYL CITRATE (PF) 100 MCG/2ML IJ SOLN
INTRAMUSCULAR | Status: AC
  Administered 2021-11-04: 14:00:00 50 ug via INTRAVENOUS
  Filled 2021-11-04: qty 2

## 2021-11-04 MED ORDER — LIDOCAINE 2% (20 MG/ML) 5 ML SYRINGE
INTRAMUSCULAR | Status: DC | PRN
  Administered 2021-11-04: 3 mg via INTRAVENOUS

## 2021-11-04 MED ORDER — KETOROLAC TROMETHAMINE 15 MG/ML IJ SOLN
15.0000 mg | Freq: Four times a day (QID) | INTRAMUSCULAR | Status: DC
  Administered 2021-11-04 – 2021-11-05 (×2): 15 mg via INTRAVENOUS
  Filled 2021-11-04 (×2): qty 1

## 2021-11-04 MED ORDER — FENTANYL CITRATE PF 50 MCG/ML IJ SOSY
50.0000 ug | PREFILLED_SYRINGE | Freq: Once | INTRAMUSCULAR | Status: AC
  Administered 2021-11-04: 13:00:00 50 ug via INTRAVENOUS
  Filled 2021-11-04: qty 1

## 2021-11-04 MED ORDER — ALBUMIN HUMAN 5 % IV SOLN: INTRAVENOUS | Status: DC | PRN

## 2021-11-04 MED ORDER — FENTANYL CITRATE PF 50 MCG/ML IJ SOSY: 50.0000 ug | PREFILLED_SYRINGE | Freq: Once | INTRAMUSCULAR | Status: DC

## 2021-11-04 MED ORDER — MIDAZOLAM HCL 2 MG/2ML IJ SOLN
INTRAMUSCULAR | Status: AC
  Filled 2021-11-04: qty 2

## 2021-11-04 MED ORDER — PHENYLEPHRINE HCL (PRESSORS) 10 MG/ML IV SOLN
INTRAVENOUS | Status: DC | PRN
  Administered 2021-11-04: 100 ug via INTRAVENOUS

## 2021-11-04 MED ORDER — CEFAZOLIN SODIUM-DEXTROSE 2-3 GM-%(50ML) IV SOLR
INTRAVENOUS | Status: DC | PRN
  Administered 2021-11-04 (×2): 2 g via INTRAVENOUS

## 2021-11-04 MED ORDER — FENTANYL CITRATE PF 50 MCG/ML IJ SOSY
PREFILLED_SYRINGE | INTRAMUSCULAR | Status: AC | PRN
  Administered 2021-11-04 (×3): 50 ug via INTRAVENOUS

## 2021-11-04 MED ORDER — ROCURONIUM BROMIDE 10 MG/ML (PF) SYRINGE
PREFILLED_SYRINGE | INTRAVENOUS | Status: AC
  Filled 2021-11-04: qty 30

## 2021-11-04 MED ORDER — SUGAMMADEX SODIUM 200 MG/2ML IV SOLN
INTRAVENOUS | Status: DC | PRN
  Administered 2021-11-04: 400 mg via INTRAVENOUS

## 2021-11-04 MED ORDER — DOCUSATE SODIUM 100 MG PO CAPS
100.0000 mg | ORAL_CAPSULE | Freq: Two times a day (BID) | ORAL | Status: DC
  Administered 2021-11-04 – 2021-11-14 (×8): 100 mg via ORAL
  Filled 2021-11-04 (×13): qty 1

## 2021-11-04 MED ORDER — CEFAZOLIN SODIUM-DEXTROSE 2-4 GM/100ML-% IV SOLN
2.0000 g | Freq: Three times a day (TID) | INTRAVENOUS | Status: AC
  Administered 2021-11-05 (×3): 2 g via INTRAVENOUS
  Filled 2021-11-04 (×3): qty 100

## 2021-11-04 MED ORDER — ONDANSETRON 4 MG PO TBDP: 4.0000 mg | ORAL_TABLET | Freq: Four times a day (QID) | ORAL | Status: DC | PRN

## 2021-11-04 MED ORDER — FENTANYL CITRATE (PF) 100 MCG/2ML IJ SOLN: 50.0000 ug | Freq: Once | INTRAMUSCULAR | Status: AC

## 2021-11-04 MED ORDER — OXYCODONE HCL 5 MG PO TABS
5.0000 mg | ORAL_TABLET | ORAL | Status: DC | PRN
Start: 2021-11-04 — End: 2021-11-06
  Administered 2021-11-05 – 2021-11-06 (×5): 10 mg via ORAL
  Filled 2021-11-04 (×5): qty 2

## 2021-11-04 SURGICAL SUPPLY — 138 items
BAG COUNTER SPONGE SURGICOUNT (BAG) ×6 IMPLANT
BAG SPNG CNTER NS LX DISP (BAG) ×5
BAG SURGICOUNT SPONGE COUNTING (BAG) ×1
BANDAGE ESMARK 6X9 LF (GAUZE/BANDAGES/DRESSINGS) ×5 IMPLANT
BIT DRILL 3.3 LONG (BIT) ×3 IMPLANT
BIT DRILL 3.8X6 NS (BIT) ×5 IMPLANT
BIT DRILL 4.3 (BIT) ×2 IMPLANT
BIT DRILL 4.4 NS (BIT) ×5 IMPLANT
BIT DRILL 4.8X200 CANN (BIT) ×3 IMPLANT
BIT DRILL CALIBRATED 4.3MMX365 (DRILL) ×4 IMPLANT
BIT DRILL CROWE PNT TWST 4.5MM (DRILL) IMPLANT
BIT DRILL QC 3.3X195 (BIT) ×3 IMPLANT
BLADE CLIPPER SURG (BLADE) IMPLANT
BLADE SURG 10 STRL SS (BLADE) ×7 IMPLANT
BLADE SURG 15 STRL LF DISP TIS (BLADE) ×5 IMPLANT
BLADE SURG 15 STRL SS (BLADE) ×7
BNDG CMPR 9X6 STRL LF SNTH (GAUZE/BANDAGES/DRESSINGS) ×5
BNDG CMPR MED 10X6 ELC LF (GAUZE/BANDAGES/DRESSINGS) ×5
BNDG COHESIVE 4X5 TAN STRL (GAUZE/BANDAGES/DRESSINGS) ×7 IMPLANT
BNDG ELASTIC 4X5.8 VLCR STR LF (GAUZE/BANDAGES/DRESSINGS) ×10 IMPLANT
BNDG ELASTIC 6X10 VLCR STRL LF (GAUZE/BANDAGES/DRESSINGS) ×4 IMPLANT
BNDG ELASTIC 6X5.8 VLCR STR LF (GAUZE/BANDAGES/DRESSINGS) ×7 IMPLANT
BNDG ESMARK 6X9 LF (GAUZE/BANDAGES/DRESSINGS) ×7
BNDG GAUZE ELAST 4 BULKY (GAUZE/BANDAGES/DRESSINGS) ×7 IMPLANT
BRUSH SCRUB EZ PLAIN DRY (MISCELLANEOUS) ×14 IMPLANT
CANISTER SUCT 3000ML PPV (MISCELLANEOUS) ×7 IMPLANT
CAP LOCK NCB (Cap) ×9 IMPLANT
COVER MAYO STAND STRL (DRAPES) ×7 IMPLANT
COVER SURGICAL LIGHT HANDLE (MISCELLANEOUS) ×14 IMPLANT
CUFF TOURN SGL QUICK 34 (TOURNIQUET CUFF) ×7
CUFF TRNQT CYL 34X4.125X (TOURNIQUET CUFF) ×5 IMPLANT
DRAPE C-ARM 42X72 X-RAY (DRAPES) ×10 IMPLANT
DRAPE C-ARMOR (DRAPES) ×13 IMPLANT
DRAPE HALF SHEET 40X57 (DRAPES) IMPLANT
DRAPE IMP U-DRAPE 54X76 (DRAPES) ×7 IMPLANT
DRAPE INCISE IOBAN 66X45 STRL (DRAPES) ×7 IMPLANT
DRAPE LAPAROTOMY TRNSV 102X78 (DRAPES) ×10 IMPLANT
DRAPE ORTHO SPLIT 77X108 STRL (DRAPES) ×14
DRAPE SURG ORHT 6 SPLT 77X108 (DRAPES) ×10 IMPLANT
DRAPE U-SHAPE 47X51 STRL (DRAPES) ×7 IMPLANT
DRESSING MEPILEX FLEX 4X4 (GAUZE/BANDAGES/DRESSINGS) IMPLANT
DRILL BIT 4.3 (BIT) ×7
DRILL CALIBRATED 4.3MMX365 (DRILL) ×14
DRILL CROWE POINT TWIST 4.5MM (DRILL) ×7
DRSG ADAPTIC 3X8 NADH LF (GAUZE/BANDAGES/DRESSINGS) ×7 IMPLANT
DRSG MEPILEX BORDER 4X4 (GAUZE/BANDAGES/DRESSINGS) ×7 IMPLANT
DRSG MEPILEX FLEX 4X4 (GAUZE/BANDAGES/DRESSINGS) ×21
DRSG MEPITEL 4X7.2 (GAUZE/BANDAGES/DRESSINGS) ×7 IMPLANT
DRSG MEPITEL 8X12 (GAUZE/BANDAGES/DRESSINGS) ×4 IMPLANT
DRSG PAD ABDOMINAL 8X10 ST (GAUZE/BANDAGES/DRESSINGS) ×14 IMPLANT
ELECT REM PT RETURN 9FT ADLT (ELECTROSURGICAL) ×7
ELECTRODE REM PT RTRN 9FT ADLT (ELECTROSURGICAL) ×5 IMPLANT
EVACUATOR 1/8 PVC DRAIN (DRAIN) IMPLANT
GAUZE 4X4 16PLY ~~LOC~~+RFID DBL (SPONGE) ×3 IMPLANT
GAUZE SPONGE 4X4 12PLY STRL (GAUZE/BANDAGES/DRESSINGS) ×16 IMPLANT
GAUZE XEROFORM 1X8 LF (GAUZE/BANDAGES/DRESSINGS) ×7 IMPLANT
GLOVE SRG 8 PF TXTR STRL LF DI (GLOVE) ×5 IMPLANT
GLOVE SURG ENC MOIS LTX SZ7.5 (GLOVE) ×7 IMPLANT
GLOVE SURG ENC MOIS LTX SZ8 (GLOVE) ×7 IMPLANT
GLOVE SURG ENC MOIS LTX SZ8.5 (GLOVE) ×7 IMPLANT
GLOVE SURG ORTHO LTX SZ7.5 (GLOVE) ×9 IMPLANT
GLOVE SURG UNDER POLY LF SZ7.5 (GLOVE) ×7 IMPLANT
GLOVE SURG UNDER POLY LF SZ8 (GLOVE) ×7
GOWN STRL REUS W/ TWL LRG LVL3 (GOWN DISPOSABLE) ×10 IMPLANT
GOWN STRL REUS W/ TWL XL LVL3 (GOWN DISPOSABLE) ×5 IMPLANT
GOWN STRL REUS W/TWL LRG LVL3 (GOWN DISPOSABLE) ×14
GOWN STRL REUS W/TWL XL LVL3 (GOWN DISPOSABLE) ×7
GUIDE PIN DRILL TIP 2.8X450HIP (PIN) ×14
GUIDEPIN 3.2X17.5 THRD DISP (PIN) ×6 IMPLANT
GUIDEWIRE BALL NOSE 80CM (WIRE) ×2 IMPLANT
GUIDEWIRE BEAD TIP (WIRE) ×4 IMPLANT
IMMOBILIZER KNEE 22 UNIV (SOFTGOODS) ×7 IMPLANT
K-WIRE 2.0 (WIRE) ×21
K-WIRE FXSTD 280X2XNS SS (WIRE) ×15
KIT BASIN OR (CUSTOM PROCEDURE TRAY) ×7 IMPLANT
KIT TURNOVER KIT B (KITS) ×7 IMPLANT
KWIRE FXSTD 280X2XNS SS (WIRE) ×9 IMPLANT
MANIFOLD NEPTUNE II (INSTRUMENTS) ×7 IMPLANT
MEPITEL 8X12 (GAUZE/BANDAGES/DRESSINGS) ×2
NAIL FEM RETRO 9X380 (Orthopedic Implant) ×2 IMPLANT
NAIL TIBIAL 8X37.5 (Nail) ×2 IMPLANT
NDL SUT 6 .5 CRC .975X.05 MAYO (NEEDLE) IMPLANT
NEEDLE MAYO TAPER (NEEDLE)
NS IRRIG 1000ML POUR BTL (IV SOLUTION) ×7 IMPLANT
PACK GENERAL/GYN (CUSTOM PROCEDURE TRAY) ×7 IMPLANT
PACK ORTHO EXTREMITY (CUSTOM PROCEDURE TRAY) ×7 IMPLANT
PACK UNIVERSAL I (CUSTOM PROCEDURE TRAY) ×7 IMPLANT
PAD ABD 8X10 STRL (GAUZE/BANDAGES/DRESSINGS) ×5 IMPLANT
PAD ARMBOARD 7.5X6 YLW CONV (MISCELLANEOUS) ×14 IMPLANT
PAD CAST 4YDX4 CTTN HI CHSV (CAST SUPPLIES) ×7 IMPLANT
PADDING CAST COTTON 4X4 STRL (CAST SUPPLIES) ×14
PADDING CAST COTTON 6X4 STRL (CAST SUPPLIES) ×10 IMPLANT
PIN GUIDE DRILL TIP 2.8X450HIP (PIN) ×2 IMPLANT
PIN GUIDE THRD TT 3.8 (PIN) ×4 IMPLANT
PLATE LAT PROX 3H TIB 5H LEFT (Miscellaneous) ×3 IMPLANT
SCREW ACE CAP BN (Screw) ×7 IMPLANT
SCREW ACECAP 48MM (Screw) ×4 IMPLANT
SCREW BN OBLQ FT 54X4.5XST 2 (Screw) ×2 IMPLANT
SCREW CANN 8X145X40 (Screw) ×2 IMPLANT
SCREW CANN 8X165X40 (Screw) ×2 IMPLANT
SCREW CORT TI DBL LEAD 5X32 (Screw) ×6 IMPLANT
SCREW CORT TI DBL LEAD 5X56 (Screw) ×5 IMPLANT
SCREW CORT TI DBL LEAD 5X70 (Screw) ×3 IMPLANT
SCREW CORT TI DBL LEAD 5X75 (Screw) ×3 IMPLANT
SCREW HUM NCB PA ST 4X60 (Screw) ×3 IMPLANT
SCREW NCB 4.0 32MM (Screw) ×8 IMPLANT
SCREW NCB 4.0X36MM (Screw) ×4 IMPLANT
SCREW NCB 4X3 4X70 (Screw) ×3 IMPLANT
SCREW NCB PA ST 4X3.4X85 (Screw) ×4 IMPLANT
SCREW PROX ST NCB 4X80 (Screw) ×8 IMPLANT
SCREW PROXIMAL DEPUY (Screw) ×14 IMPLANT
SCREW PRXML FT 50X5.5XLCK NS (Screw) IMPLANT
SCREW PRXML FT 65X5.5XNS CORT (Screw) ×1 IMPLANT
SPONGE T-LAP 18X18 ~~LOC~~+RFID (SPONGE) ×27 IMPLANT
STAPLER VISISTAT 35W (STAPLE) ×7 IMPLANT
STOCKINETTE IMPERVIOUS LG (DRAPES) ×7 IMPLANT
SUCTION FRAZIER HANDLE 10FR (MISCELLANEOUS) ×7
SUCTION TUBE FRAZIER 10FR DISP (MISCELLANEOUS) ×5 IMPLANT
SUT ETHILON 2 0 FS 18 (SUTURE) ×32 IMPLANT
SUT PDS AB 2-0 CT1 27 (SUTURE) ×12 IMPLANT
SUT PROLENE 0 CT 2 (SUTURE) ×14 IMPLANT
SUT PROLENE 3 0 PS 2 (SUTURE) IMPLANT
SUT VIC AB 0 CT1 27 (SUTURE) ×7
SUT VIC AB 0 CT1 27XBRD ANBCTR (SUTURE) ×5 IMPLANT
SUT VIC AB 1 CT1 27 (SUTURE) ×7
SUT VIC AB 1 CT1 27XBRD ANBCTR (SUTURE) ×5 IMPLANT
SUT VIC AB 2-0 CT1 27 (SUTURE) ×14
SUT VIC AB 2-0 CT1 TAPERPNT 27 (SUTURE) ×10 IMPLANT
SUT VIC AB 2-0 CT3 27 (SUTURE) IMPLANT
SUT VIC AB 2-0 FS1 27 (SUTURE) ×7 IMPLANT
TOWEL GREEN STERILE (TOWEL DISPOSABLE) ×14 IMPLANT
TOWEL GREEN STERILE FF (TOWEL DISPOSABLE) ×7 IMPLANT
TRAY FOLEY MTR SLVR 16FR STAT (SET/KITS/TRAYS/PACK) IMPLANT
TUBE CONNECTING 12'X1/4 (SUCTIONS) ×1
TUBE CONNECTING 12X1/4 (SUCTIONS) ×6 IMPLANT
UNDERPAD 30X36 HEAVY ABSORB (UNDERPADS AND DIAPERS) ×7 IMPLANT
WATER STERILE IRR 1000ML POUR (IV SOLUTION) ×14 IMPLANT
YANKAUER SUCT BULB TIP NO VENT (SUCTIONS) ×7 IMPLANT
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
## 2021-11-04 NOTE — Anesthesia Postprocedure Evaluation (Signed)
Anesthesia Post Note  Patient: [name]  Procedure(s) Performed: INTRAMEDULLARY (IM) RETROGRADE FEMORAL NAILING (Right: Leg Upper) INTRAMEDULLARY (IM) NAIL TIBIAL (Right: Leg Lower) SACRO-ILIAC PINNING RIGHT TO LEFT (Right: Pelvis) OPEN REDUCTION INTERNAL FIXATION (ORIF) TIBIAL PLATEAU (Left: Knee) FACIAL LACERATION REPAIR     Patient location during evaluation: PACU Anesthesia Type: General Level of consciousness: awake and alert and oriented Pain management: pain level controlled Vital Signs Assessment: post-procedure vital signs reviewed and stable Respiratory status: spontaneous breathing, nonlabored ventilation and respiratory function stable Cardiovascular status: blood pressure returned to baseline Postop Assessment: no apparent nausea or vomiting Anesthetic complications: no   No notable events documented.  Last Vitals:  Vitals:   11/04/21 2155 11/04/21 2206  BP: (!) 144/84 (!) 149/80  Pulse: (!) 121   Resp: 16 12  Temp: 37.1 C 37.7 C  SpO2: 99%        Shanda Howells

## 2021-11-04 NOTE — Anesthesia Preprocedure Evaluation (Addendum)
Anesthesia Evaluation  Patient identified by MRN, date of birth, ID band Patient awake    Reviewed: Allergy & Precautions, H&P , NPO status , Patient's Chart, lab work & pertinent test results  Airway Mallampati: III   Neck ROM: Limited    Dental no notable dental hx.    Pulmonary neg pulmonary ROS, Current Smoker,    Pulmonary exam normal breath sounds clear to auscultation       Cardiovascular negative cardio ROS Normal cardiovascular exam Rhythm:Regular Rate:Normal     Neuro/Psych negative neurological ROS  negative psych ROS   GI/Hepatic negative GI ROS, Neg liver ROS, (+)     substance abuse  IV drug use, Hepatitis -, C  Endo/Other  negative endocrine ROS  Renal/GU negative Renal ROS  negative genitourinary   Musculoskeletal negative musculoskeletal ROS (+) narcotic dependent  Abdominal   Peds negative pediatric ROS (+) ADHD Hematology negative hematology ROS (+)   Anesthesia Other Findings   Reproductive/Obstetrics negative OB ROS                            Anesthesia Physical Anesthesia Plan  ASA: 3  Anesthesia Plan: General   Post-op Pain Management:    Induction: Intravenous  PONV Risk Score and Plan: 1 and Ondansetron and Treatment may vary due to age or medical condition  Airway Management Planned: Oral ETT and Video Laryngoscope Planned  Additional Equipment:   Intra-op Plan:   Post-operative Plan: Extubation in OR  Informed Consent: I have reviewed the patients History and Physical, chart, labs and discussed the procedure including the risks, benefits and alternatives for the proposed anesthesia with the patient or authorized representative who has indicated his/her understanding and acceptance.     Dental advisory given  Plan Discussed with: CRNA  Anesthesia Plan Comments:       Anesthesia Quick Evaluation

## 2021-11-04 NOTE — ED Notes (Signed)
Report given to Tobey Bride of Short Stay [phone]

## 2021-11-04 NOTE — Transfer of Care (Signed)
Immediate Anesthesia Transfer of Care Note  Patient: Luna Fuse  Procedure(s) Performed: INTRAMEDULLARY (IM) RETROGRADE FEMORAL NAILING (Right: Leg Upper) INTRAMEDULLARY (IM) NAIL TIBIAL (Right: Leg Lower) SACRO-ILIAC PINNING RIGHT TO LEFT (Right: Pelvis) OPEN REDUCTION INTERNAL FIXATION (ORIF) TIBIAL PLATEAU (Left: Knee) FACIAL LACERATION REPAIR  Patient Location: PACU  Anesthesia Type:General  Level of Consciousness: drowsy  Airway & Oxygen Therapy: Patient Spontanous Breathing and Patient connected to face mask oxygen  Post-op Assessment: Report given to RN and Post -op Vital signs reviewed and stable  Post vital signs: Reviewed and stable  Last Vitals:  Vitals Value Taken Time  BP 143/89 11/04/21 2025  Temp    Pulse 97 11/04/21 2026  Resp 22 11/04/21 2026  SpO2 100 % 11/04/21 2026  Vitals shown include unvalidated device data.  Last Pain:  Vitals:   11/04/21 1400  PainSc: 10-Worst pain ever         Complications: No notable events documented.
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
## 2021-11-04 NOTE — Op Note (Signed)
  11/04/2021  6:40 PM  PATIENT:  [name]  30 y.o. male  PRE-OPERATIVE DIAGNOSIS: Complex laceration lower lip 3 cm, complex laceration chin 6 cm  POST-OPERATIVE DIAGNOSIS:  Complex laceration lower lip 3 cm, complex laceration chin 6 cm  PROCEDURE:  Procedure(s): Irrigation and simple closure of complex laceration lower lip 3 cm, complex laceration chin 6 cm   SURGEON: Violeta Gelinas, MD  ASSISTANTS: Barnetta Chapel, PA-C  ANESTHESIA:   general  EBL:  Total I/O In: 2250 [I.V.:2000; IV Piggyback:250] Out: 370 [Urine:220; Blood:150]  BLOOD ADMINISTERED:none  DRAINS: none   SPECIMEN:  No Specimen  DISPOSITION OF SPECIMEN:  N/A  COUNTS:  YES  DICTATION: .Dragon Dictation Procedure detail: Informed consent was obtained.  He received intravenous antibiotics.  He was brought to the operating room and general endotracheal anesthesia was administered by the anesthesia staff.  His lower face and chin were prepped and draped in sterile fashion.  We did a timeout procedure.  I debrided some jagged skin along the edge of his chin laceration.  Chin laceration was thoroughly irrigated and closed with interrupted 4-0 Prolene sutures.  His lip laceration was also irrigated.  There was a stellate portion.  It was 3 cm total in length and it was closed with interrupted 4-0 chromic lining up the vermilion border well.  A sterile gauze dressing was applied.  He remained in the operating room at the completion of our procedure with Dr. Carola Frost.  Our counts were correct.  There were no complications. PATIENT DISPOSITION:   Remains in the operating room with Dr. Carola Frost   Delay start of Pharmacological VTE agent (>24hrs) due to surgical blood loss or risk of bleeding:  yes  Violeta Gelinas, MD, MPH, FACS Pager: [phone]  11/17/20226:40 PM

## 2021-11-04 NOTE — Anesthesia Procedure Notes (Signed)
Procedure Name: Intubation Date/Time: 11/04/2021 3:26 PM Performed by: Gwenyth Allegra, CRNA Pre-anesthesia Checklist: Patient identified, Emergency Drugs available, Suction available, Patient being monitored and Timeout performed Patient Re-evaluated:Patient Re-evaluated prior to induction Oxygen Delivery Method: Circle system utilized Preoxygenation: Pre-oxygenation with 100% oxygen Induction Type: IV induction Ventilation: Mask ventilation without difficulty Laryngoscope Size: Glidescope Tube type: Oral Tube size: 7.5 mm Airway Equipment and Method: Stylet Secured at: 21 cm Dental Injury: Teeth and Oropharynx as per pre-operative assessment

## 2021-11-04 NOTE — ED Notes (Signed)
Patient transported to CT 

## 2021-11-04 NOTE — OR Nursing (Signed)
Care of patient assumed at 57.
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
## 2021-11-05 LAB — COMPREHENSIVE METABOLIC PANEL
ALT: 63 U/L — ABNORMAL HIGH (ref 0–44)
AST: 131 U/L — ABNORMAL HIGH (ref 15–41)
Albumin: 2.7 g/dL — ABNORMAL LOW (ref 3.5–5.0)
Alkaline Phosphatase: 37 U/L — ABNORMAL LOW (ref 38–126)
Anion gap: 7 (ref 5–15)
BUN: 16 mg/dL (ref 6–20)
CO2: 23 mmol/L (ref 22–32)
Calcium: 8 mg/dL — ABNORMAL LOW (ref 8.9–10.3)
Chloride: 103 mmol/L (ref 98–111)
Creatinine, Ser: 1.14 mg/dL (ref 0.61–1.24)
GFR, Estimated: >60 mL/min (ref >60)
Glucose, Bld: 169 mg/dL — ABNORMAL HIGH (ref 70–99)
Potassium: 4 mmol/L (ref 3.5–5.1)
Sodium: 133 mmol/L — ABNORMAL LOW (ref 135–145)
Total Bilirubin: 0.5 mg/dL (ref 0.3–1.2)
Total Protein: 5.3 g/dL — ABNORMAL LOW (ref 6.5–8.1)

## 2021-11-05 LAB — CBC
HCT: 22.4 % — ABNORMAL LOW (ref 39.0–52.0)
Hemoglobin: 8 g/dL — ABNORMAL LOW (ref 13.0–17.0)
MCH: 30.1 pg (ref 26.0–34.0)
MCHC: 35.7 g/dL (ref 30.0–36.0)
MCV: 84.2 fL (ref 80.0–100.0)
Platelets: 177 10*3/uL (ref 150–400)
RBC: 2.66 MIL/uL — ABNORMAL LOW (ref 4.22–5.81)
RDW: 12.9 % (ref 11.5–15.5)
WBC: 11.9 10*3/uL — ABNORMAL HIGH (ref 4.0–10.5)
nRBC: 0 % (ref 0.0–0.2)

## 2021-11-05 LAB — VITAMIN D 25 HYDROXY (VIT D DEFICIENCY, FRACTURES): Vit D, 25-Hydroxy: 46.2 ng/mL (ref 30–100)

## 2021-11-05 LAB — HIV ANTIBODY (ROUTINE TESTING W REFLEX): HIV Screen 4th Generation wRfx: NONREACTIVE

## 2021-11-05 LAB — LACTIC ACID, PLASMA: Lactic Acid, Venous: 1.9 mmol/L (ref 0.5–1.9)

## 2021-11-05 MED ORDER — LIDOCAINE 5 % EX PTCH
3.0000 | MEDICATED_PATCH | CUTANEOUS | Status: DC
  Administered 2021-11-05 – 2021-11-15 (×11): 3 via TRANSDERMAL
  Filled 2021-11-05 (×10): qty 3

## 2021-11-05 MED ORDER — KETOROLAC TROMETHAMINE 15 MG/ML IJ SOLN
30.0000 mg | Freq: Four times a day (QID) | INTRAMUSCULAR | Status: AC
  Administered 2021-11-05 – 2021-11-09 (×18): 30 mg via INTRAVENOUS
  Filled 2021-11-05 (×18): qty 2

## 2021-11-05 MED ORDER — HYDROMORPHONE HCL 1 MG/ML IJ SOLN: 0.5000 mg | INTRAMUSCULAR | Status: DC | PRN

## 2021-11-05 MED ORDER — TRAMADOL HCL 50 MG PO TABS
50.0000 mg | ORAL_TABLET | Freq: Four times a day (QID) | ORAL | Status: DC
  Administered 2021-11-05 – 2021-11-06 (×4): 50 mg via ORAL
  Filled 2021-11-05 (×4): qty 1

## 2021-11-05 MED ORDER — ASCORBIC ACID 500 MG PO TABS
1000.0000 mg | ORAL_TABLET | Freq: Every day | ORAL | Status: DC
  Administered 2021-11-05 – 2021-11-15 (×11): 1000 mg via ORAL
  Filled 2021-11-05 (×11): qty 2

## 2021-11-05 MED ORDER — LACTATED RINGERS IV SOLN: INTRAVENOUS | Status: DC

## 2021-11-05 MED ORDER — HYDROMORPHONE HCL 1 MG/ML IJ SOLN
0.5000 mg | INTRAMUSCULAR | Status: DC | PRN
Start: 2021-11-05 — End: 2021-11-06
  Administered 2021-11-05 (×3): 1 mg via INTRAVENOUS
  Filled 2021-11-05 (×3): qty 1

## 2021-11-05 MED ORDER — CHLORHEXIDINE GLUCONATE CLOTH 2 % EX PADS
6.0000 | MEDICATED_PAD | Freq: Every day | CUTANEOUS | Status: DC
  Administered 2021-11-05 – 2021-11-08 (×2): 6 via TOPICAL
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
## 2021-11-05 NOTE — Progress Notes (Signed)
? ?  Inpatient Rehab Admissions Coordinator : ? ?Per therapy recommendations, patient was screened for CIR candidacy by Gildardo Tickner RN MSN.  At this time patient appears to be a potential candidate for CIR. I will place a rehab consult per protocol for full assessment. Please call me with any questions. ? ?Desree Leap RN MSN ?Admissions Coordinator ?[phone] ?  ?
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
## 2021-11-05 NOTE — Progress Notes (Signed)
Orthopedic Tech Progress Note Patient Details:  [name] 1991-08-20 [id number] Called in order to hanger for ROM knee brace Patient ID: [name], male   DOB: September 07, 1991, 30 y.o.   MRN: [id number]  [name] 11/05/2021, 11:56 AM

## 2021-11-05 NOTE — Progress Notes (Signed)
Pt. Refused to reapply neck brace despite encouragement and education for safety.
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
## 2021-11-06 ENCOUNTER — Inpatient Hospital Stay (HOSPITAL_COMMUNITY): Payer: No Typology Code available for payment source

## 2021-11-06 LAB — CBC
HCT: 17.4 % — ABNORMAL LOW (ref 39.0–52.0)
Hemoglobin: 6 g/dL — CL (ref 13.0–17.0)
MCH: 29 pg (ref 26.0–34.0)
MCHC: 34.5 g/dL (ref 30.0–36.0)
MCV: 84.1 fL (ref 80.0–100.0)
Platelets: 147 10*3/uL — ABNORMAL LOW (ref 150–400)
RBC: 2.07 MIL/uL — ABNORMAL LOW (ref 4.22–5.81)
RDW: 12.8 % (ref 11.5–15.5)
WBC: 11.9 10*3/uL — ABNORMAL HIGH (ref 4.0–10.5)
nRBC: 0 % (ref 0.0–0.2)

## 2021-11-06 LAB — HEMOGLOBIN AND HEMATOCRIT, BLOOD
HCT: 21.2 % — ABNORMAL LOW (ref 39.0–52.0)
Hemoglobin: 7.2 g/dL — ABNORMAL LOW (ref 13.0–17.0)

## 2021-11-06 LAB — BASIC METABOLIC PANEL
Anion gap: 8 (ref 5–15)
BUN: 17 mg/dL (ref 6–20)
CO2: 25 mmol/L (ref 22–32)
Calcium: 7.8 mg/dL — ABNORMAL LOW (ref 8.9–10.3)
Chloride: 100 mmol/L (ref 98–111)
Creatinine, Ser: 0.91 mg/dL (ref 0.61–1.24)
GFR, Estimated: >60 mL/min (ref >60)
Glucose, Bld: 132 mg/dL — ABNORMAL HIGH (ref 70–99)
Potassium: 3.8 mmol/L (ref 3.5–5.1)
Sodium: 133 mmol/L — ABNORMAL LOW (ref 135–145)

## 2021-11-06 LAB — ABO/RH: ABO/RH(D): A POS

## 2021-11-06 LAB — PREPARE RBC (CROSSMATCH)

## 2021-11-06 MED ORDER — NICOTINE 21 MG/24HR TD PT24
21.0000 mg | MEDICATED_PATCH | Freq: Every day | TRANSDERMAL | Status: DC
  Administered 2021-11-06 – 2021-11-14 (×9): 21 mg via TRANSDERMAL
  Filled 2021-11-06 (×10): qty 1

## 2021-11-06 MED ORDER — OXYCODONE HCL 5 MG PO TABS
10.0000 mg | ORAL_TABLET | ORAL | Status: DC | PRN
  Administered 2021-11-06: 5 mg via ORAL
  Administered 2021-11-06 – 2021-11-08 (×9): 15 mg via ORAL
  Filled 2021-11-06 (×5): qty 3
  Filled 2021-11-06: qty 2
  Filled 2021-11-06 (×4): qty 3

## 2021-11-06 MED ORDER — SODIUM CHLORIDE 0.9% IV SOLUTION: Freq: Once | INTRAVENOUS | Status: AC

## 2021-11-06 MED ORDER — TRAMADOL HCL 50 MG PO TABS
100.0000 mg | ORAL_TABLET | Freq: Four times a day (QID) | ORAL | Status: DC
  Administered 2021-11-06 – 2021-11-15 (×36): 100 mg via ORAL
  Filled 2021-11-06 (×38): qty 2

## 2021-11-06 MED ORDER — HYDROMORPHONE HCL 1 MG/ML IJ SOLN
0.5000 mg | Freq: Four times a day (QID) | INTRAMUSCULAR | Status: DC | PRN
  Administered 2021-11-06 (×2): 1 mg via INTRAVENOUS
  Filled 2021-11-06 (×2): qty 1

## 2021-11-06 NOTE — Progress Notes (Signed)
Inpatient Rehab Admissions:  Inpatient Rehab Consult received.  I met with patient and mother, [name] at the bedside for rehabilitation assessment and to discuss goals and expectations of an inpatient rehab admission.  Spoke with mother as pt was interacting with another visitor.  Explained CIR goals and expectations.  She acknowledged understanding. She is interested in pt pursuing CIR.  She requested [facility] contact Development worker, community.  AC left message with Kenda Revels. Will continue to follow.  Signed: Gayland Curry, [place], [place] Admissions Coordinator [phone]
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
## 2021-11-06 NOTE — Progress Notes (Signed)
TRN was called for assistance in helping to place an order for a blood transfusion by this patients primary RN. TRN was informed that patient hgb was low and that Dr. Cliffton Asters wanted to transfuse 2 units of blood. TRN reported to the unit and helped primary RN place orders. Orders were placed and blood bank called and verified orders and stated they were placed correctly but pt needs to have an order for a type and screen, which will need to be collected. Blood bank stated that 2 pink top tubes will need to be sent to lab. Primary RN called phlebotomist who stated she is in the department and will be there shortly. Phlebotomist currently at bedside at 6:03 who asked for extra clarification on how to have the lab sample collected. This RN called the blood bank who stated that 2 pink top specimen tubes need to be sent down. 1 with all the required type and screen information including, patient label, blood arm band sticker, time collected and person who collected it and the 2nd tube with the patient label and who collected it and the time. Blood bank stated the blood needs to be drawn from 2 different sites at 2 different times. Phlebotomist was informed of this. Primary RN stated she will be obtaining informed consent for the transfusion.

## 2021-11-06 NOTE — Progress Notes (Signed)
Doctor Cliffton Asters was made aware of critical hgb value and gave verbal order for 2 units of PRBC. Orders placed with appropriate subsequent post transfusion blood work along with order for informed consent to transfuse. After order was placed blood bank called and informed that 2 pink tops for type and screen would need to be collected. Phlebotomist contacted for emergent type and screen. Phleb stated she would be here shortly.

## 2021-11-06 NOTE — Plan of Care (Signed)
  Problem: Clinical Measurements: Goal: Ability to maintain clinical measurements within normal limits will improve Outcome: Progressing Goal: Will remain free from infection Outcome: Progressing Goal: Diagnostic test results will improve Outcome: Progressing Goal: Respiratory complications will improve Outcome: Progressing Goal: Cardiovascular complication will be avoided Outcome: Progressing   Problem: Nutrition: Goal: Adequate nutrition will be maintained Outcome: Progressing   Problem: Coping: Goal: Level of anxiety will decrease Outcome: Progressing   Problem: Elimination: Goal: Will not experience complications related to bowel motility Outcome: Progressing Goal: Will not experience complications related to urinary retention Outcome: Progressing   Problem: Pain Managment: Goal: General experience of comfort will improve Outcome: Progressing   Problem: Safety: Goal: Ability to remain free from injury will improve Outcome: Progressing   Problem: Skin Integrity: Goal: Risk for impaired skin integrity will decrease Outcome: Progressing   Problem: Education: Goal: Knowledge of General Education information will improve Description: Including pain rating scale, medication(s)/side effects and non-pharmacologic comfort measures Outcome: Progressing   Problem: Health Behavior/Discharge Planning: Goal: Ability to manage health-related needs will improve Outcome: Progressing   Problem: Clinical Measurements: Goal: Ability to maintain clinical measurements within normal limits will improve Outcome: Progressing Goal: Will remain free from infection Outcome: Progressing Goal: Diagnostic test results will improve Outcome: Progressing Goal: Respiratory complications will improve Outcome: Progressing Goal: Cardiovascular complication will be avoided Outcome: Progressing   Problem: Activity: Goal: Risk for activity intolerance will decrease Outcome: Progressing    Problem: Nutrition: Goal: Adequate nutrition will be maintained Outcome: Progressing   Problem: Coping: Goal: Level of anxiety will decrease Outcome: Progressing   Problem: Elimination: Goal: Will not experience complications related to bowel motility Outcome: Progressing Goal: Will not experience complications related to urinary retention Outcome: Progressing   Problem: Pain Managment: Goal: General experience of comfort will improve Outcome: Progressing   Problem: Safety: Goal: Ability to remain free from injury will improve Outcome: Progressing   Problem: Skin Integrity: Goal: Risk for impaired skin integrity will decrease Outcome: Progressing

## 2021-11-06 NOTE — Progress Notes (Signed)
Foley catheter removed at 1130.  Patient has not voided yet.  Bladder scan at currently.  Will continue to monitor and have night shift RN recheck in a couple hours if still unable to void.  Patient encouraged to drink fluids.

## 2021-11-06 NOTE — Progress Notes (Signed)
MD on call made aware of critical hgb lab value

## 2021-11-07 LAB — HEMOGLOBIN AND HEMATOCRIT, BLOOD
HCT: 23.3 % — ABNORMAL LOW (ref 39.0–52.0)
Hemoglobin: 7.8 g/dL — ABNORMAL LOW (ref 13.0–17.0)

## 2021-11-07 LAB — CBC
HCT: 19.2 % — ABNORMAL LOW (ref 39.0–52.0)
Hemoglobin: 6.5 g/dL — CL (ref 13.0–17.0)
MCH: 29.4 pg (ref 26.0–34.0)
MCHC: 33.9 g/dL (ref 30.0–36.0)
MCV: 86.9 fL (ref 80.0–100.0)
Platelets: 113 10*3/uL — ABNORMAL LOW (ref 150–400)
RBC: 2.21 MIL/uL — ABNORMAL LOW (ref 4.22–5.81)
RDW: 13.4 % (ref 11.5–15.5)
WBC: 8.9 10*3/uL (ref 4.0–10.5)
nRBC: 0 % (ref 0.0–0.2)

## 2021-11-07 LAB — PREPARE RBC (CROSSMATCH)

## 2021-11-07 MED ORDER — HYDROMORPHONE HCL 1 MG/ML IJ SOLN
0.5000 mg | Freq: Three times a day (TID) | INTRAMUSCULAR | Status: DC | PRN
  Administered 2021-11-07 (×2): 0.5 mg via INTRAVENOUS
  Filled 2021-11-07 (×2): qty 0.5

## 2021-11-07 MED ORDER — TAMSULOSIN HCL 0.4 MG PO CAPS
0.4000 mg | ORAL_CAPSULE | Freq: Every day | ORAL | Status: DC
  Administered 2021-11-07 – 2021-11-15 (×9): 0.4 mg via ORAL
  Filled 2021-11-07 (×9): qty 1

## 2021-11-07 MED ORDER — SODIUM CHLORIDE 0.9% IV SOLUTION: Freq: Once | INTRAVENOUS | Status: DC
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
## 2021-11-07 NOTE — Plan of Care (Signed)
  Problem: Clinical Measurements: Goal: Ability to maintain clinical measurements within normal limits will improve Outcome: Progressing Goal: Will remain free from infection Outcome: Progressing Goal: Diagnostic test results will improve Outcome: Progressing Goal: Respiratory complications will improve Outcome: Progressing Goal: Cardiovascular complication will be avoided Outcome: Progressing   Problem: Nutrition: Goal: Adequate nutrition will be maintained Outcome: Progressing   Problem: Coping: Goal: Level of anxiety will decrease Outcome: Progressing   Problem: Elimination: Goal: Will not experience complications related to bowel motility Outcome: Progressing Goal: Will not experience complications related to urinary retention Outcome: Progressing   Problem: Pain Managment: Goal: General experience of comfort will improve Outcome: Progressing   Problem: Safety: Goal: Ability to remain free from injury will improve Outcome: Progressing   Problem: Skin Integrity: Goal: Risk for impaired skin integrity will decrease Outcome: Progressing   Problem: Education: Goal: Knowledge of General Education information will improve Description: Including pain rating scale, medication(s)/side effects and non-pharmacologic comfort measures Outcome: Progressing   Problem: Health Behavior/Discharge Planning: Goal: Ability to manage health-related needs will improve Outcome: Progressing   Problem: Clinical Measurements: Goal: Ability to maintain clinical measurements within normal limits will improve Outcome: Progressing Goal: Will remain free from infection Outcome: Progressing Goal: Diagnostic test results will improve Outcome: Progressing Goal: Respiratory complications will improve Outcome: Progressing Goal: Cardiovascular complication will be avoided Outcome: Progressing   Problem: Activity: Goal: Risk for activity intolerance will decrease Outcome: Progressing    Problem: Nutrition: Goal: Adequate nutrition will be maintained Outcome: Progressing   Problem: Coping: Goal: Level of anxiety will decrease Outcome: Progressing   Problem: Elimination: Goal: Will not experience complications related to bowel motility Outcome: Progressing Goal: Will not experience complications related to urinary retention Outcome: Progressing   Problem: Pain Managment: Goal: General experience of comfort will improve Outcome: Progressing   Problem: Safety: Goal: Ability to remain free from injury will improve Outcome: Progressing   Problem: Skin Integrity: Goal: Risk for impaired skin integrity will decrease Outcome: Progressing
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
## 2021-11-08 ENCOUNTER — Inpatient Hospital Stay (HOSPITAL_COMMUNITY): Payer: No Typology Code available for payment source

## 2021-11-08 LAB — COMPREHENSIVE METABOLIC PANEL
ALT: 46 U/L — ABNORMAL HIGH (ref 0–44)
AST: 133 U/L — ABNORMAL HIGH (ref 15–41)
Albumin: 2 g/dL — ABNORMAL LOW (ref 3.5–5.0)
Alkaline Phosphatase: 30 U/L — ABNORMAL LOW (ref 38–126)
Anion gap: 5 (ref 5–15)
BUN: 12 mg/dL (ref 6–20)
CO2: 27 mmol/L (ref 22–32)
Calcium: 7.6 mg/dL — ABNORMAL LOW (ref 8.9–10.3)
Chloride: 103 mmol/L (ref 98–111)
Creatinine, Ser: 0.73 mg/dL (ref 0.61–1.24)
GFR, Estimated: >60 mL/min (ref >60)
Glucose, Bld: 118 mg/dL — ABNORMAL HIGH (ref 70–99)
Potassium: 3.5 mmol/L (ref 3.5–5.1)
Sodium: 135 mmol/L (ref 135–145)
Total Bilirubin: 1 mg/dL (ref 0.3–1.2)
Total Protein: 4.7 g/dL — ABNORMAL LOW (ref 6.5–8.1)

## 2021-11-08 LAB — TYPE AND SCREEN
ABO/RH(D): A POS
Antibody Screen: NEGATIVE
Unit division: 0
Unit division: 0
Unit division: 0

## 2021-11-08 LAB — BPAM RBC
Blood Product Expiration Date: 202212092359
Blood Product Expiration Date: 202212132359
Blood Product Expiration Date: 202212202359
ISSUE DATE / TIME: 202211190837
ISSUE DATE / TIME: 202211191106
ISSUE DATE / TIME: 202211201143
Unit Type and Rh: 6200
Unit Type and Rh: 6200
Unit Type and Rh: 6200

## 2021-11-08 LAB — CBC
HCT: 20.5 % — ABNORMAL LOW (ref 39.0–52.0)
Hemoglobin: 7 g/dL — ABNORMAL LOW (ref 13.0–17.0)
MCH: 30.2 pg (ref 26.0–34.0)
MCHC: 34.1 g/dL (ref 30.0–36.0)
MCV: 88.4 fL (ref 80.0–100.0)
Platelets: 133 10*3/uL — ABNORMAL LOW (ref 150–400)
RBC: 2.32 MIL/uL — ABNORMAL LOW (ref 4.22–5.81)
RDW: 13.3 % (ref 11.5–15.5)
WBC: 6.9 10*3/uL (ref 4.0–10.5)
nRBC: 0 % (ref 0.0–0.2)

## 2021-11-08 MED ORDER — MORPHINE SULFATE ER 15 MG PO TBCR
30.0000 mg | EXTENDED_RELEASE_TABLET | Freq: Two times a day (BID) | ORAL | Status: DC
  Administered 2021-11-08 – 2021-11-15 (×15): 30 mg via ORAL
  Filled 2021-11-08 (×16): qty 2

## 2021-11-08 MED ORDER — OXYCODONE HCL 5 MG PO TABS
5.0000 mg | ORAL_TABLET | ORAL | Status: DC | PRN
  Administered 2021-11-08 – 2021-11-14 (×21): 10 mg via ORAL
  Filled 2021-11-08 (×21): qty 2

## 2021-11-08 MED ORDER — FERROUS SULFATE 325 (65 FE) MG PO TABS
325.0000 mg | ORAL_TABLET | Freq: Three times a day (TID) | ORAL | Status: DC
  Administered 2021-11-08 – 2021-11-15 (×20): 325 mg via ORAL
  Filled 2021-11-08 (×21): qty 1

## 2021-11-08 NOTE — PMR Pre-admission (Shared)
PMR Admission Coordinator Pre-Admission Assessment  Patient: [name] is an 30 y.o., male MRN: [id number] DOB: 11-04-1991 Height: 5\' 10"  (177.8 cm) Weight: 68 kg  Insurance Information HMO:     PPO:      PCP:      IPA:      80/20:      OTHER:  PRIMARY: UNINSURED      Policy#:       Subscriber:  CM Name:       Phone#:      Fax#:  Pre-Cert#:       Employer:  Benefits:  Phone #:      Name:  Eff. Date:      Deduct:       Out of Pocket Max:       Life Max:  CIR:       SNF:  Outpatient:      Co-Pay:  Home Health:       Co-Pay:  DME:      Co-Pay:  Providers:  SECONDARY:       Policy#:      Phone#:   Financial Counselor: Wilburn Cornelia      Phone#: [phone]  The Data Collection Information Summary for patients in Inpatient Rehabilitation Facilities with attached Privacy Act [place] Records was provided and verbally reviewed with: N/A  Emergency Contact Information Contact Information     Name Relation Home Work Mobile   Darin, Arndt Mother [phone]         Current Medical History  Patient Admitting Diagnosis: *** History of Present Illness: ***    Patient's medical record from [facility] has been reviewed by the rehabilitation admission coordinator and physician.  Past Medical History  Past Medical History:  Diagnosis Date   ADHD (attention deficit hyperactivity disorder)    Hepatitis C    Heroin abuse ([place])     Has the patient had major surgery during 100 days prior to admission? Yes  Family History   family history is not on file.  Current Medications  Current Facility-Administered Medications:    acetaminophen (TYLENOL) tablet 1,000 mg, 1,000 mg, Oral, Q6H, Ainsley Spinner, PA-C, 1,000 mg at 11/08/21 1805   ascorbic acid (VITAMIN C) tablet 1,000 mg, 1,000 mg, Oral, Daily, Ainsley Spinner, PA-C, 1,000 mg at 11/08/21 3235   Chlorhexidine Gluconate Cloth 2 % PADS 6 each, 6 each, Topical, Daily, Bergman, Meghan D, NP, 6 each at  11/08/21 0942   docusate sodium (COLACE) capsule 100 mg, 100 mg, Oral, BID, Ainsley Spinner, PA-C, 100 mg at 11/05/21 2224   ferrous sulfate tablet 325 mg, 325 mg, Oral, TID WC, Lovick, Montel Culver, MD, 325 mg at 11/08/21 1717   HYDROmorphone (DILAUDID) injection 0.5 mg, 0.5 mg, Intravenous, Q8H PRN, Jesusita Oka, MD, 0.5 mg at 11/07/21 1930   ketorolac (TORADOL) 15 MG/ML injection 30 mg, 30 mg, Intravenous, Q6H, Lovick, Montel Culver, MD, 30 mg at 11/08/21 1717   lactated ringers infusion, , Intravenous, Continuous, Lovick, Montel Culver, MD, Last Rate: 75 mL/hr at 11/08/21 1600, New Bag at 11/08/21 1600   lidocaine (LIDODERM) 5 % 3 patch, 3 patch, Transdermal, Q24H, Lovick, Montel Culver, MD, 3 patch at 11/08/21 0942   methocarbamol (ROBAXIN) tablet 1,000 mg, 1,000 mg, Oral, TID, Ainsley Spinner, PA-C, 1,000 mg at 11/08/21 1557   morphine (MS CONTIN) 12 hr tablet 30 mg, 30 mg, Oral, Q12H, Jesusita Oka, MD, 30 mg at 11/08/21 0942   nicotine (NICODERM CQ - dosed in  mg/24 hours) patch 21 mg, 21 mg, Transdermal, Daily, Lovick, Montel Culver, MD, 21 mg at 11/08/21 0941   ondansetron (ZOFRAN-ODT) disintegrating tablet 4 mg, 4 mg, Oral, Q6H PRN **OR** ondansetron (ZOFRAN) injection 4 mg, 4 mg, Intravenous, Q6H PRN, Ainsley Spinner, PA-C   oxyCODONE (Oxy IR/ROXICODONE) immediate release tablet 5-10 mg, 5-10 mg, Oral, Q4H PRN, Jesusita Oka, MD, 10 mg at 11/08/21 1805   polyethylene glycol (MIRALAX / GLYCOLAX) packet 17 g, 17 g, Oral, Daily, Ainsley Spinner, PA-C, 17 g at 11/05/21 1008   tamsulosin (FLOMAX) capsule 0.4 mg, 0.4 mg, Oral, Daily, Lovick, Montel Culver, MD, 0.4 mg at 11/08/21 0942   traMADol (ULTRAM) tablet 100 mg, 100 mg, Oral, Q6H, Lovick, Montel Culver, MD, 100 mg at 11/08/21 1717  Patients Current Diet:  Diet Order             Diet regular Room service appropriate? No; Fluid consistency: Thin  Diet effective now                   Precautions / Restrictions Precautions Precautions: Fall, Cervical Precaution  Booklet Issued: No Precaution Comments: unrestricted ROM bilateral hip/knee/ankle Cervical Brace: Hard collar, At all times Other Brace: hinged L knee brace - unlocked Restrictions Weight Bearing Restrictions: Yes RLE Weight Bearing: Non weight bearing LLE Weight Bearing: Non weight bearing   Has the patient had 2 or more falls or a fall with injury in the past year? No  Prior Activity Level Community (5-7x/wk): working, driving, gets out of house daily  Prior Functional Level Self Care: Did the patient need help bathing, dressing, using the toilet or eating? Independent  Indoor Mobility: Did the patient need assistance with walking from room to room (with or without device)? Independent  Stairs: Did the patient need assistance with internal or external stairs (with or without device)? Independent  Functional Cognition: Did the patient need help planning regular tasks such as shopping or remembering to take medications? Independent  Patient Information    Patient's Response To:     Home Assistive Devices / Equipment Home Equipment: None  Prior Device Use: Indicate devices/aids used by the patient prior to current illness, exacerbation or injury? None of the above  Current Functional Level Cognition  Overall Cognitive Status: Within Functional Limits for tasks assessed Orientation Level: Oriented X4 General Comments: pleasant today Rancho BuildDNA.es Scales of Cognitive Functioning: Confused/inappropriate/non-agitated    Extremity Assessment (includes Sensation/Coordination)  Upper Extremity Assessment: Overall WFL for tasks assessed  Lower Extremity Assessment: Defer to PT evaluation RLE Deficits / Details: weakness due to recent surgery. Patient able to assist bringing LEs towards EOB. Little participation this session RLE: Unable to fully assess due to pain LLE Deficits / Details: weakness due to recent surgery. Patient able to assist bringing LEs towards EOB. Little  participation this session LLE: Unable to fully assess due to pain    ADLs  Overall ADL's : Needs assistance/impaired Eating/Feeding: Moderate assistance, Bed level Grooming: Maximal assistance, Bed level Upper Body Bathing: Maximal assistance, Bed level, Sitting Lower Body Bathing: Total assistance, Bed level Upper Body Dressing : Maximal assistance, Bed level, Sitting Lower Body Dressing: Total assistance, Bed level Toilet Transfer: Minimal assistance, +2 for physical assistance (lateral scoots towards HOB) Functional mobility during ADLs: Minimal assistance, +2 for physical assistance, +2 for safety/equipment (lateral scoot) General ADL Comments: Pt presenting with decreased balance, strength, cognition, and safety    Mobility  Overal bed mobility: Needs Assistance Bed Mobility: Supine to Sit  Supine to sit: Supervision Sit to supine: Mod assist General bed mobility comments: Progressed to long sitting without physical assist    Transfers  Overall transfer level: Needs assistance Equipment used: None Transfers: Bed to chair/wheelchair/BSC Bed to/from chair/wheelchair/BSC transfer type:: Lateral/scoot transfer  Lateral/Scoot Transfers: Min assist General transfer comment: MinA for transfer from bed > w/c towards right, pt locking w/c, assist for BLE's off edge of bed to lower down. Pt needed cues for locking w/c upon return to bed in addition to help for set up    Ambulation / Gait / Stairs / Environmental consultant mobility: Yes Wheelchair propulsion: Both upper extremities Wheelchair parts: Independent Distance: 600    Posture / Balance Balance Overall balance assessment: Mild deficits observed, not formally tested    Special needs/care consideration {Special Care Needs/Care Considerations:304600603}   Previous Home Environment (from acute therapy documentation) Living Arrangements: Parent  Lives With: Family Available Help at Discharge:  Family, Available 24 hours/day Type of Home: House Home Layout: One level Home Access: Stairs to enter Entrance Stairs-Rails: None Entrance Stairs-Number of Steps: 2 Bathroom Shower/Tub: Chiropodist: Handicapped height Bathroom Accessibility: Yes How Accessible: Accessible via walker [place]: No  Discharge Living Setting Plans for Discharge Living Setting: Patient's home Type of Home at Discharge: House Discharge Home Layout: One level Discharge Home Access: Stairs to enter Entrance Stairs-Rails: None Entrance Stairs-Number of Steps: 2 Discharge Bathroom Shower/Tub: Tub/shower unit Discharge Bathroom Toilet: Handicapped height Discharge Bathroom Accessibility: Yes How Accessible: Accessible via walker Does the patient have any problems obtaining your medications?: No  Social/Family/Support Systems Patient Roles: Parent Anticipated Caregiver: [name], mother Anticipated Caregiver's Contact Information: [phone] Caregiver Availability: 24/7 Discharge Plan Discussed with Primary Caregiver: Yes Is Caregiver In Agreement with Plan?: Yes Does Caregiver/Family have Issues with Lodging/Transportation while Pt is in Rehab?: No  Goals Patient/Family Goal for Rehab: *** Expected length of stay: *** Pt/Family Agrees to Admission and willing to participate: Yes Program Orientation Provided & Reviewed with Pt/Caregiver Including Roles  & Responsibilities: Yes  Decrease burden of Care through IP rehab admission: NA  Possible need for SNF placement upon discharge: Not anticipated  Patient Condition: I have reviewed medical records from [facility], spoken with {CHL IP CSW JJ:[id number]}, and patient and family member. I met with patient at the bedside for inpatient rehabilitation assessment.  Patient will benefit from ongoing PT, OT, and SLP, can actively participate in 3 hours of therapy a day 5 days of the week, and can make measurable  gains during the admission.  Patient will also benefit from the coordinated team approach during an Inpatient Acute Rehabilitation admission.  The patient will receive intensive therapy as well as Rehabilitation physician, nursing, social worker, and care management interventions.  Due to {due KZ:6010932} the patient requires 24 hour a day rehabilitation nursing.  The patient is currently *** with mobility and basic ADLs.  Discharge setting and therapy post discharge at [facility] IP discharge location:[id number]} is anticipated.  Patient has agreed to participate in the Acute Inpatient Rehabilitation Program and will admit {Time; today/tomorrow:10263}.  Preadmission Screen Completed By:  Bethel Born, 11/08/2021 6:59 PM ______________________________________________________________________   Discussed status with Dr. Marland Kitchen on *** at *** and received approval for admission today.  Admission Coordinator:  Bethel Born, CCC-SLP, time ***/Date ***   Assessment/Plan: Diagnosis: Does the need for close, 24 hr/day Medical supervision in concert with the patient's rehab needs make it unreasonable for  this patient to be served in a less intensive setting? {yes_no_potentially:3041433} Co-Morbidities requiring supervision/potential complications: *** Due to {due QX:4758307}, does the patient require 24 hr/day rehab nursing? {yes_no_potentially:3041433} Does the patient require coordinated care of a physician, rehab nurse, PT, OT, and SLP to address physical and functional deficits in the context of the above medical diagnosis(es)? {yes_no_potentially:3041433} Addressing deficits in the following areas: {deficits:3041436} Can the patient actively participate in an intensive therapy program of at least 3 hrs of therapy 5 days a week? {yes_no_potentially:3041433} The potential for patient to make measurable gains while on inpatient rehab is {potential:3041437} Anticipated functional outcomes upon  discharge from inpatient rehab: {functional outcomes:304600100} PT, {functional outcomes:304600100} OT, {functional outcomes:304600100} SLP Estimated rehab length of stay to reach the above functional goals is: *** Anticipated discharge destination: {anticipated dc setting:21604} 10. Overall Rehab/Functional Prognosis: {potential:3041437}   MD Signature: ***

## 2021-11-08 NOTE — Progress Notes (Signed)
Inpatient Rehab Admissions Coordinator:  Saw pt at bedside. Pt said he is agreeable to continuing to pursue CIR. Awaiting medical clearance and bed availability. Will continue to follow.   Wolfgang Phoenix, MS, CCC-SLP Admissions Coordinator [phone]

## 2021-11-08 NOTE — Plan of Care (Signed)
  Problem: Clinical Measurements: Goal: Ability to maintain clinical measurements within normal limits will improve Outcome: Progressing Goal: Will remain free from infection Outcome: Progressing Goal: Diagnostic test results will improve Outcome: Progressing Goal: Respiratory complications will improve Outcome: Progressing Goal: Cardiovascular complication will be avoided Outcome: Progressing   Problem: Nutrition: Goal: Adequate nutrition will be maintained Outcome: Progressing   Problem: Coping: Goal: Level of anxiety will decrease Outcome: Progressing   Problem: Elimination: Goal: Will not experience complications related to bowel motility Outcome: Progressing Goal: Will not experience complications related to urinary retention Outcome: Progressing   Problem: Pain Managment: Goal: General experience of comfort will improve Outcome: Progressing   Problem: Safety: Goal: Ability to remain free from injury will improve Outcome: Progressing   Problem: Skin Integrity: Goal: Risk for impaired skin integrity will decrease Outcome: Progressing   Problem: Education: Goal: Knowledge of General Education information will improve Description: Including pain rating scale, medication(s)/side effects and non-pharmacologic comfort measures Outcome: Progressing   Problem: Health Behavior/Discharge Planning: Goal: Ability to manage health-related needs will improve Outcome: Progressing   Problem: Clinical Measurements: Goal: Ability to maintain clinical measurements within normal limits will improve Outcome: Progressing Goal: Will remain free from infection Outcome: Progressing Goal: Diagnostic test results will improve Outcome: Progressing Goal: Respiratory complications will improve Outcome: Progressing Goal: Cardiovascular complication will be avoided Outcome: Progressing   Problem: Activity: Goal: Risk for activity intolerance will decrease Outcome: Progressing    Problem: Nutrition: Goal: Adequate nutrition will be maintained Outcome: Progressing   Problem: Coping: Goal: Level of anxiety will decrease Outcome: Progressing   Problem: Elimination: Goal: Will not experience complications related to bowel motility Outcome: Progressing Goal: Will not experience complications related to urinary retention Outcome: Progressing   Problem: Pain Managment: Goal: General experience of comfort will improve Outcome: Progressing   Problem: Safety: Goal: Ability to remain free from injury will improve Outcome: Progressing   Problem: Skin Integrity: Goal: Risk for impaired skin integrity will decrease Outcome: Progressing
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
## 2021-11-09 LAB — COMPREHENSIVE METABOLIC PANEL
ALT: 64 U/L — ABNORMAL HIGH (ref 0–44)
AST: 170 U/L — ABNORMAL HIGH (ref 15–41)
Albumin: 2.2 g/dL — ABNORMAL LOW (ref 3.5–5.0)
Alkaline Phosphatase: 37 U/L — ABNORMAL LOW (ref 38–126)
Anion gap: 8 (ref 5–15)
BUN: 12 mg/dL (ref 6–20)
CO2: 27 mmol/L (ref 22–32)
Calcium: 8 mg/dL — ABNORMAL LOW (ref 8.9–10.3)
Chloride: 103 mmol/L (ref 98–111)
Creatinine, Ser: 0.76 mg/dL (ref 0.61–1.24)
GFR, Estimated: >60 mL/min (ref >60)
Glucose, Bld: 95 mg/dL (ref 70–99)
Potassium: 3.7 mmol/L (ref 3.5–5.1)
Sodium: 138 mmol/L (ref 135–145)
Total Bilirubin: 1.1 mg/dL (ref 0.3–1.2)
Total Protein: 5.2 g/dL — ABNORMAL LOW (ref 6.5–8.1)

## 2021-11-09 LAB — CBC
HCT: 22.9 % — ABNORMAL LOW (ref 39.0–52.0)
Hemoglobin: 7.5 g/dL — ABNORMAL LOW (ref 13.0–17.0)
MCH: 29.4 pg (ref 26.0–34.0)
MCHC: 32.8 g/dL (ref 30.0–36.0)
MCV: 89.8 fL (ref 80.0–100.0)
Platelets: 176 10*3/uL (ref 150–400)
RBC: 2.55 MIL/uL — ABNORMAL LOW (ref 4.22–5.81)
RDW: 13.2 % (ref 11.5–15.5)
WBC: 7.2 10*3/uL (ref 4.0–10.5)
nRBC: 0 % (ref 0.0–0.2)

## 2021-11-09 MED ORDER — SODIUM CHLORIDE 0.9% FLUSH
3.0000 mL | Freq: Two times a day (BID) | INTRAVENOUS | Status: DC
  Administered 2021-11-09 – 2021-11-15 (×13): 3 mL via INTRAVENOUS

## 2021-11-09 MED ORDER — ENOXAPARIN SODIUM 30 MG/0.3ML IJ SOSY
30.0000 mg | PREFILLED_SYRINGE | Freq: Two times a day (BID) | INTRAMUSCULAR | Status: DC
Start: 2021-11-09 — End: 2021-11-15
  Administered 2021-11-09 – 2021-11-15 (×12): 30 mg via SUBCUTANEOUS
  Filled 2021-11-09 (×12): qty 0.3

## 2021-11-09 MED ORDER — SODIUM CHLORIDE 0.9 % IV SOLN: 250.0000 mL | INTRAVENOUS | Status: DC | PRN

## 2021-11-09 MED ORDER — SODIUM CHLORIDE 0.9% FLUSH: 3.0000 mL | INTRAVENOUS | Status: DC | PRN

## 2021-11-09 MED ORDER — METOPROLOL TARTRATE 5 MG/5ML IV SOLN: 5.0000 mg | Freq: Four times a day (QID) | INTRAVENOUS | Status: DC | PRN
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
## 2021-11-09 NOTE — Progress Notes (Signed)
Inpatient Rehab Admissions Coordinator:   I do not have a bed for this Pt. Today on CIR. I will continue to follow for potential admit pending medical readiness and bed availability.  Megan Salon, MS, CCC-SLP Rehab Admissions Coordinator  [phone] (celll) [phone] (office)

## 2021-11-10 ENCOUNTER — Encounter (HOSPITAL_COMMUNITY): Payer: Self-pay | Admitting: Orthopedic Surgery

## 2021-11-10 LAB — RENAL FUNCTION PANEL
Albumin: 2.1 g/dL — ABNORMAL LOW (ref 3.5–5.0)
Anion gap: 6 (ref 5–15)
BUN: 11 mg/dL (ref 6–20)
CO2: 29 mmol/L (ref 22–32)
Calcium: 7.7 mg/dL — ABNORMAL LOW (ref 8.9–10.3)
Chloride: 101 mmol/L (ref 98–111)
Creatinine, Ser: 0.8 mg/dL (ref 0.61–1.24)
GFR, Estimated: >60 mL/min
Glucose, Bld: 122 mg/dL — ABNORMAL HIGH (ref 70–99)
Phosphorus: 3.9 mg/dL (ref 2.5–4.6)
Potassium: 4 mmol/L (ref 3.5–5.1)
Sodium: 136 mmol/L (ref 135–145)

## 2021-11-10 LAB — CBC
HCT: 21.8 % — ABNORMAL LOW (ref 39.0–52.0)
Hemoglobin: 7.5 g/dL — ABNORMAL LOW (ref 13.0–17.0)
MCH: 30.5 pg (ref 26.0–34.0)
MCHC: 34.4 g/dL (ref 30.0–36.0)
MCV: 88.6 fL (ref 80.0–100.0)
Platelets: 202 10*3/uL (ref 150–400)
RBC: 2.46 MIL/uL — ABNORMAL LOW (ref 4.22–5.81)
RDW: 13.6 % (ref 11.5–15.5)
WBC: 6.5 10*3/uL (ref 4.0–10.5)
nRBC: 0 % (ref 0.0–0.2)

## 2021-11-10 MED ORDER — METHOCARBAMOL 500 MG PO TABS
1000.0000 mg | ORAL_TABLET | Freq: Four times a day (QID) | ORAL | Status: DC
  Administered 2021-11-10 – 2021-11-15 (×20): 1000 mg via ORAL
  Filled 2021-11-10 (×22): qty 2
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
## 2021-11-10 NOTE — Progress Notes (Signed)
Inpatient Rehab Admissions Coordinator:   I do not have a bed for  this pt. On CIR today. Will continue to follow for potential admit pending medical readiness and bed availability; however, Pt. Is already min guard with wheelchair transfers and may be able to discharge home if he continues to work with acute therapies.   Megan Salon, MS, CCC-SLP Rehab Admissions Coordinator  [phone] (celll) [phone] (office)
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
## 2021-11-11 NOTE — Progress Notes (Signed)
New onset of bruising and swelling in scrotum area noted. Ice applied.Trauma RN notified.

## 2021-11-12 NOTE — Progress Notes (Signed)
Inpatient Rehab Admissions Coordinator:    Pt. Is completing wheelchair transfers at supervision level and is NWB on BLEs. I do not think that pt. Would benefit from the intensity of CIR at this time and may be able to discharge home. CIR will sign off at this time.  Megan Salon, MS, CCC-SLP Rehab Admissions Coordinator  [phone] (celll) [phone] (office)

## 2021-11-12 NOTE — Progress Notes (Signed)
Received request to call mom, call made to Lanelle Bal- mom to answer any questions. Per mom patient does not have any insurance and she is asking about applying for Medicaid. Will have FC follow up with mom next week after the holiday. Email sent to [facility] with [facility] regarding potential Medicaid- Per Dennison Bulla FC to follow up on Monday 11/28.
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
## 2021-11-13 NOTE — Progress Notes (Addendum)
OT Cancellation Note  Patient Details Name: [name] MRN: [id number] DOB: 1991-08-08   Cancelled Treatment:    Reason Eval/Treat Not Completed: Other refused stating he is ready to go home.  Pt made very little eye contact and interacted very minimally.  He repeatedly stated "I am ready to go home", and states he can manage fine"  when asked about the ramp, he reports he will build it himself.  Reiterated with him his NWB status and safety associated with such.  He states he will bump up on his bottom.  I again explained safety issues related to such.  He states he can have relatives or neighbors bump him up the stairs in a w/c, but unable to provide info re: who that might be.  He then refused to further work with OT.  Girlfriend was present.   Eber Jones., OTR/L Acute Rehabilitation Services Pager [phone] Office [phone]  Jeani Hawking M 11/13/2021, 1:40 PM

## 2021-11-13 NOTE — Progress Notes (Signed)
TRN note-   Called by chg nurse on 4NP- pt wanting to leave AMA, throwing things at girlfriend and staff-- Dr. Sheliah Hatch paged.  On my arrival to room, pt is despondant, hanging head, says "I am leaving no matter what"  This TRN talked with pt about needing a wheelchair and that if he left AMA he would not be able to get one. He is NWB- states he will crawl if he has to.  Social Worker notified, regarding equipment and to talk with pt. After talking with social worker, pt has decided to stay. THis RN talked with him, he states he wants to be left alone and rest at this time. He is redirectable at present time, and is agreeable to stay. He does not want to be discharged home to his mom's house-  Requests that no information be given to mother and not to contact her regarding  his care.   Will make TMD aware also.   Anell Barr, RN Trauma Response Nurse [phone]
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
## 2021-11-13 NOTE — TOC Progression Note (Signed)
Transition of Care [facility]) - Progression Note    Patient Details  Name: [name] MRN: [id number] Date of Birth: 1991/06/13  [facility] CM/SW [place], [place] Phone Number: 11/13/2021, 2:44 PM  Clinical Narrative:    CSW contacted by Trauma RN regarding patient wanting to leave AMA but needing a wheelchair as he is non-weight bearing. CSW talked with [facility] regarding charity wheelchair and notes if patient leaves AMA he will not be eligible for a charity wheelchair. CSW met with patient and informed him of the situation and discussed his concerns regarding wanting to leave. CSW notes concerns that he is only here due to waiting for his mother to build a ramp up into her home. Patient reported he thinks his mother may not build the ramp as she wants him to stay here in the hospital. CSW inquired if she could call his mom about it but he stated he did not want CSW to speak with the mom. CSW noted patient reported he cannot leave without a wheelchair and thought he would still get one. CSW informed unfortunately not with an AMA discharge. Patient verbalized understanding and discussed his frustration with CSW. Ultimately patient decided to sign out AMA. CSW will reach out to financial counseling and she is uncertain if they have been involved yet regarding insurance status.    Expected Discharge Plan: IP Rehab Facility Barriers to Discharge: Continued Medical Work up  Expected Discharge Plan and Services Expected Discharge Plan: [place]   Discharge Planning Services: CM Consult   Living arrangements for the past 2 months: Single Family Home                                       Social Determinants of Health (SDOH) Interventions    Readmission Risk Interventions No flowsheet data found.

## 2021-11-13 NOTE — Plan of Care (Signed)
  Problem: Clinical Measurements: Goal: Ability to maintain clinical measurements within normal limits will improve Outcome: Progressing Goal: Will remain free from infection Outcome: Progressing Goal: Diagnostic test results will improve Outcome: Progressing Goal: Respiratory complications will improve Outcome: Progressing Goal: Cardiovascular complication will be avoided Outcome: Progressing   Problem: Nutrition: Goal: Adequate nutrition will be maintained Outcome: Progressing   Problem: Coping: Goal: Level of anxiety will decrease Outcome: Progressing   Problem: Elimination: Goal: Will not experience complications related to bowel motility Outcome: Progressing Goal: Will not experience complications related to urinary retention Outcome: Progressing   Problem: Pain Managment: Goal: General experience of comfort will improve Outcome: Progressing   Problem: Safety: Goal: Ability to remain free from injury will improve Outcome: Progressing   Problem: Skin Integrity: Goal: Risk for impaired skin integrity will decrease Outcome: Progressing   Problem: Education: Goal: Knowledge of General Education information will improve Description: Including pain rating scale, medication(s)/side effects and non-pharmacologic comfort measures Outcome: Progressing   Problem: Health Behavior/Discharge Planning: Goal: Ability to manage health-related needs will improve Outcome: Progressing   Problem: Clinical Measurements: Goal: Ability to maintain clinical measurements within normal limits will improve Outcome: Progressing Goal: Will remain free from infection Outcome: Progressing Goal: Diagnostic test results will improve Outcome: Progressing Goal: Respiratory complications will improve Outcome: Progressing Goal: Cardiovascular complication will be avoided Outcome: Progressing   Problem: Activity: Goal: Risk for activity intolerance will decrease Outcome: Progressing    Problem: Nutrition: Goal: Adequate nutrition will be maintained Outcome: Progressing   Problem: Coping: Goal: Level of anxiety will decrease Outcome: Progressing   Problem: Elimination: Goal: Will not experience complications related to bowel motility Outcome: Progressing Goal: Will not experience complications related to urinary retention Outcome: Progressing   Problem: Pain Managment: Goal: General experience of comfort will improve Outcome: Progressing   Problem: Safety: Goal: Ability to remain free from injury will improve Outcome: Progressing   Problem: Skin Integrity: Goal: Risk for impaired skin integrity will decrease Outcome: Progressing

## 2021-11-13 NOTE — Progress Notes (Signed)
6 sutures removed from pt's chin, per order. Lip sutures left intact.   Robina Ade, RN

## 2021-11-14 MED ORDER — SODIUM CHLORIDE 0.9 % IV SOLN
12.5000 mg | Freq: Four times a day (QID) | INTRAVENOUS | Status: DC | PRN
  Administered 2021-11-14: 15:00:00 12.5 mg via INTRAVENOUS
  Filled 2021-11-14: qty 0.5

## 2021-11-14 MED ORDER — PROMETHAZINE HCL 25 MG PO TABS: 12.5000 mg | ORAL_TABLET | Freq: Four times a day (QID) | ORAL | Status: DC | PRN

## 2021-11-14 MED ORDER — HALOPERIDOL LACTATE 5 MG/ML IJ SOLN: 5.0000 mg | Freq: Four times a day (QID) | INTRAMUSCULAR | Status: DC | PRN
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
## 2021-11-14 NOTE — Progress Notes (Addendum)
Pt vomited around 0430. RN administered zofran. Pt still c/o of nausea and states he wont be able to keep his pills down. Trauma PA, MD and Trauma RN paged. Will continue to monitor closely.  Caswell Corwin, RN 11/14/21 5:40 AM

## 2021-11-14 NOTE — Progress Notes (Signed)
Upon entering pts room pt was vomiting and requesting medication to "make it stop". RN paged Trauma PA. Pt refused for nurse tech to get his vital signs.
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
## 2021-11-15 ENCOUNTER — Other Ambulatory Visit: Payer: Self-pay

## 2021-11-15 ENCOUNTER — Other Ambulatory Visit (HOSPITAL_COMMUNITY): Payer: Self-pay

## 2021-11-15 MED ORDER — OXYCODONE HCL 5 MG PO TABS
5.0000 mg | ORAL_TABLET | Freq: Four times a day (QID) | ORAL | 0 refills | Status: AC | PRN
  Filled 2021-11-15: qty 30, 7d supply, fill #0

## 2021-11-15 MED ORDER — METHOCARBAMOL 500 MG PO TABS
1000.0000 mg | ORAL_TABLET | Freq: Four times a day (QID) | ORAL | 0 refills | Status: AC
  Filled 2021-11-15: qty 56, 7d supply, fill #0

## 2021-11-15 MED ORDER — RIVAROXABAN 10 MG PO TABS
10.0000 mg | ORAL_TABLET | Freq: Every day | ORAL | 0 refills | Status: AC
  Filled 2021-11-15: qty 30, 30d supply, fill #0

## 2021-11-15 MED ORDER — ACETAMINOPHEN 500 MG PO TABS
1000.0000 mg | ORAL_TABLET | Freq: Four times a day (QID) | ORAL | 0 refills | Status: AC
  Filled 2021-11-15: qty 30, 4d supply, fill #0

## 2021-11-15 MED ORDER — ASCORBIC ACID 1000 MG PO TABS
1000.0000 mg | ORAL_TABLET | Freq: Every day | ORAL | 0 refills | Status: AC
  Filled 2021-11-15: qty 30, 30d supply, fill #0

## 2021-11-15 MED ORDER — MORPHINE SULFATE ER 15 MG PO TBCR
30.0000 mg | EXTENDED_RELEASE_TABLET | Freq: Two times a day (BID) | ORAL | 0 refills | Status: AC
Start: 2021-11-15 — End: 2021-11-22
  Filled 2021-11-15: qty 28, 7d supply, fill #0

## 2021-11-15 MED ORDER — POLYETHYLENE GLYCOL 3350 17 GM/SCOOP PO POWD
17.0000 g | Freq: Every day | ORAL | 0 refills | Status: AC
  Filled 2021-11-15: qty 238, 14d supply, fill #0

## 2021-11-15 MED ORDER — TRAMADOL HCL 50 MG PO TABS
100.0000 mg | ORAL_TABLET | Freq: Four times a day (QID) | ORAL | 0 refills | Status: AC
  Filled 2021-11-15: qty 40, 5d supply, fill #0

## 2021-11-15 MED ORDER — DOCUSATE SODIUM 100 MG PO CAPS
100.0000 mg | ORAL_CAPSULE | Freq: Two times a day (BID) | ORAL | 0 refills | Status: AC
  Filled 2021-11-15: qty 10, 5d supply, fill #0

## 2021-11-15 MED ORDER — FERROUS SULFATE 325 (65 FE) MG PO TABS
325.0000 mg | ORAL_TABLET | Freq: Two times a day (BID) | ORAL | 0 refills | Status: AC
  Filled 2021-11-15: qty 90, 45d supply, fill #0
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
## 2021-11-15 NOTE — TOC Transition Note (Signed)
Transition of Care [facility]) - CM/SW Discharge Note   Patient Details  Name: [name] MRN: [id number] Date of Birth: September 01, 1991  [facility] CM/SW Contact:  Glennon Mac, RN Phone Number: 11/15/2021, 11:30  Clinical Narrative:    Patient medically stable for discharge home today; he states he is discharging to friend's house that has no stairs.  Friend and girlfriend to provide needed assistance at home.  Wheelchair and 3 in 1 requested from [employer]; DME to be delivered to room prior to discharge.  Patient aware to follow-up with Ortho MD regarding timing of outpatient therapies and referral. Pt is uninsured, but is eligible for medication assistance through [facility] program.  Discharge prescriptions sent to [facility] pharmacy to be filled using MATCH letter.  Final next level of care: Home/Self Care Barriers to Discharge: Barriers Resolved   Patient Goals and CMS Choice Patient states their goals for this hospitalization and ongoing recovery are:: to go home                            Discharge Plan and Services   Discharge Planning Services: CM Consult            DME Arranged: 3-N-1, Wheelchair manual   Date DME Agency Contacted: 11/15/21 Time DME Agency Contacted: 1158 Representative spoke with at DME Agency: Leavy Cella            Social Determinants of Health (SDOH) Interventions     Readmission Risk Interventions No flowsheet data found.  Quintella Baton, RN, BSN  Trauma/Neuro ICU Case Manager [phone]

## 2021-12-07 ENCOUNTER — Telehealth (HOSPITAL_COMMUNITY): Payer: Self-pay

## 2021-12-07 NOTE — Telephone Encounter (Signed)
Transitions of Care Pharmacy   Call attempted for a pharmacy transitions of care follow-up. HIPAA appropriate voicemail was left with call back information provided.   Call attempt #2. Will follow-up in 2-3 days.    

## 2021-12-08 ENCOUNTER — Telehealth (HOSPITAL_COMMUNITY): Payer: Self-pay

## 2021-12-08 NOTE — Telephone Encounter (Signed)
Transitions of Care Pharmacy   Call attempted for a pharmacy transitions of care follow-up. HIPAA appropriate voicemail was left with call back information provided.   Call attempt #3. Will no longer attempt follow up for TOC pharmacy.   

## 2023-08-21 ENCOUNTER — Emergency Department (HOSPITAL_COMMUNITY)
Admission: EM | Admit: 2023-08-21 | Discharge: 2023-08-21 | Disposition: A | Discharge status: Home / Self Care | Payer: MEDICAID | Attending: Emergency Medicine | Admitting: Emergency Medicine

## 2023-08-21 ENCOUNTER — Other Ambulatory Visit: Payer: Self-pay

## 2023-08-21 DIAGNOSIS — R0689 Other abnormalities of breathing: Secondary | ICD-10-CM | POA: Diagnosis not present

## 2023-08-21 DIAGNOSIS — T50904A Poisoning by unspecified drugs, medicaments and biological substances, undetermined, initial encounter: Secondary | ICD-10-CM | POA: Diagnosis not present

## 2023-08-21 DIAGNOSIS — R55 Syncope and collapse: Secondary | ICD-10-CM | POA: Diagnosis not present

## 2023-08-21 DIAGNOSIS — T40411A Poisoning by fentanyl or fentanyl analogs, accidental (unintentional), initial encounter: Secondary | ICD-10-CM | POA: Insufficient documentation

## 2023-08-21 DIAGNOSIS — T50901A Poisoning by unspecified drugs, medicaments and biological substances, accidental (unintentional), initial encounter: Secondary | ICD-10-CM

## 2023-08-21 DIAGNOSIS — R7309 Other abnormal glucose: Secondary | ICD-10-CM | POA: Diagnosis not present

## 2023-08-21 DIAGNOSIS — R001 Bradycardia, unspecified: Secondary | ICD-10-CM | POA: Insufficient documentation

## 2023-08-21 DIAGNOSIS — T887XXA Unspecified adverse effect of drug or medicament, initial encounter: Secondary | ICD-10-CM | POA: Diagnosis not present

## 2023-08-21 DIAGNOSIS — R404 Transient alteration of awareness: Secondary | ICD-10-CM | POA: Diagnosis not present

## 2023-08-21 LAB — CBC
HCT: 41.5 % (ref 39.0–52.0)
Hemoglobin: 13.7 g/dL (ref 13.0–17.0)
MCH: 28.5 pg (ref 26.0–34.0)
MCHC: 33 g/dL (ref 30.0–36.0)
MCV: 86.5 fL (ref 80.0–100.0)
Platelets: 237 10*3/uL (ref 150–400)
RBC: 4.8 MIL/uL (ref 4.22–5.81)
RDW: 13 % (ref 11.5–15.5)
WBC: 7.5 10*3/uL (ref 4.0–10.5)
nRBC: 0 % (ref 0.0–0.2)

## 2023-08-21 LAB — RAPID URINE DRUG SCREEN, HOSP PERFORMED
Amphetamines: NOT DETECTED
Barbiturates: NOT DETECTED
Benzodiazepines: NOT DETECTED
Cocaine: POSITIVE — AB
Opiates: POSITIVE — AB
Tetrahydrocannabinol: POSITIVE — AB

## 2023-08-21 LAB — COMPREHENSIVE METABOLIC PANEL
ALT: 13 U/L (ref 0–44)
AST: 17 U/L (ref 15–41)
Albumin: 3.8 g/dL (ref 3.5–5.0)
Alkaline Phosphatase: 54 U/L (ref 38–126)
Anion gap: 9 (ref 5–15)
BUN: 22 mg/dL — ABNORMAL HIGH (ref 6–20)
CO2: 29 mmol/L (ref 22–32)
Calcium: 9.3 mg/dL (ref 8.9–10.3)
Chloride: 104 mmol/L (ref 98–111)
Creatinine, Ser: 1.08 mg/dL (ref 0.61–1.24)
GFR, Estimated: >60 mL/min (ref >60)
Glucose, Bld: 134 mg/dL — ABNORMAL HIGH (ref 70–99)
Potassium: 4.3 mmol/L (ref 3.5–5.1)
Sodium: 142 mmol/L (ref 135–145)
Total Bilirubin: 0.3 mg/dL (ref 0.3–1.2)
Total Protein: 8.1 g/dL (ref 6.5–8.1)

## 2023-08-21 LAB — CBG MONITORING, ED: Glucose-Capillary: 129 mg/dL — ABNORMAL HIGH (ref 70–99)

## 2023-08-21 LAB — ETHANOL: Alcohol, Ethyl (B): <10 mg/dL (ref <10)

## 2023-08-21 MED ORDER — NALOXONE HCL 2 MG/2ML IJ SOSY: 1.0000 mg | PREFILLED_SYRINGE | Freq: Once | INTRAMUSCULAR | Status: AC

## 2023-08-21 MED ORDER — NALOXONE HCL 2 MG/2ML IJ SOSY
PREFILLED_SYRINGE | INTRAMUSCULAR | Status: AC
  Administered 2023-08-21: 1 mg via INTRAVENOUS
  Filled 2023-08-21: qty 2

## 2023-08-21 NOTE — ED Notes (Signed)
 [facility] called to have patient transported home

## 2023-08-21 NOTE — ED Provider Notes (Addendum)
[place] EMERGENCY DEPARTMENT AT [facility] Provider Note   CSN: [id number] Arrival date & time: 08/21/23  1144     History  Chief Complaint  Patient presents with   Drug Overdose    [name] is a 32 y.o. male.   Drug Overdose  Patient presented after drug overdose.  Reportedly used injection fentanyl at 1030.  Then found unresponsive.  Has bradycardia.  Reportedly told that he had injected fentanyl but is nonverbal for me.  6     Home Medications Prior to Admission medications   Medication Sig Start Date End Date Taking? Authorizing Provider  acetaminophen (TYLENOL) 500 MG tablet Take 2 tablets (1,000 mg total) by mouth every 6 (six) hours. 11/15/21   Adam Phenix, PA-C  docusate sodium (COLACE) 100 MG capsule Take 1 capsule (100 mg total) by mouth 2 (two) times daily. 11/15/21   Adam Phenix, PA-C  ferrous sulfate 325 (65 FE) MG tablet Take 1 tablet (325 mg total) by mouth 2 (two) times daily with a meal. 11/15/21 12/30/21  Simaan, Francine Graven, PA-C  oxyCODONE (OXY IR/ROXICODONE) 5 MG immediate release tablet Take 1 tablet (5 mg total) by mouth every 6 (six) hours as needed for moderate pain or severe pain (severe pain not relieved by tylenol, MS Contin). 11/15/21   Adam Phenix, PA-C  polyethylene glycol powder (GLYCOLAX/MIRALAX) 17 GM/SCOOP powder Take 17 g by mouth daily. 11/16/21   Adam Phenix, PA-C  rivaroxaban (XARELTO) 10 MG TABS tablet Take 1 tablet (10 mg total) by mouth daily. 11/15/21 12/15/21  Adam Phenix, PA-C      Allergies    Cefzil [cefprozil]    Review of Systems   Review of Systems  Physical Exam Updated Vital Signs BP (!) 151/102   Pulse (!) 48   Temp 98.2 F (36.8 C) (Oral)   Resp 13   Ht 5\' 10"  (1.778 m)   Wt 68 kg   SpO2 97%   BMI 21.52 kg/m  Physical Exam Vitals reviewed.  HENT:     Head:     Comments: Cyst anterior to left ear.  Fluctuant but not indurated. Eyes:     Comments:  Pupils around 3 mm  Cardiovascular:     Rate and Rhythm: Bradycardia present.  Chest:     Chest wall: No tenderness.  Abdominal:     Tenderness: There is no abdominal tenderness.  Neurological:     Comments: Decreased mental status.  Breathing spontaneously and bradycardic.  However will not follow commands for me.     ED Results / Procedures / Treatments   Labs (all labs ordered are listed, but only abnormal results are displayed) Labs Reviewed  COMPREHENSIVE METABOLIC PANEL - Abnormal; Notable for the following components:      Result Value   Glucose, Bld 134 (*)    BUN 22 (*)    All other components within normal limits  CBG MONITORING, ED - Abnormal; Notable for the following components:   Glucose-Capillary 129 (*)    All other components within normal limits  CBC  ETHANOL  RAPID URINE DRUG SCREEN, HOSP PERFORMED    EKG EKG Interpretation Date/Time:  Monday August 21 2023 12:16:12 EDT Ventricular Rate:  39 PR Interval:  114 QRS Duration:  97 QT Interval:  494 QTC Calculation: 398 R Axis:   79  Text Interpretation: Sinus bradycardia Atrial premature complex Borderline short PR interval Minimal ST depression, inferior leads Borderline ST elevation, anterior  leads Confirmed by Benjiman Core [phone]) on 08/21/2023 12:31:42 PM  Radiology No results found.  Procedures Procedures    Medications Ordered in ED Medications  naloxone (NARCAN) injection 1 mg (1 mg Intravenous Given 08/21/23 1203)    ED Course/ Medical Decision Making/ A&P                                 Medical Decision Making Amount and/or Complexity of Data Reviewed Labs: ordered.  Risk Prescription drug management.   Patient with mental status change.  Decreased mental status.  Reported fentanyl overdose after injection.  Initially bradycardic.  Given Narcan by EMS and then again here.  This briefly improved the bradycardia but has recurred.  Still has decreased mental status.  No  evidence of trauma.  Blood pressures been maintained. Differential diagnosis most likely toxicologic.  Has other potential overdoses such as xylazine.  Will get basic blood work and continue to monitor. Pacer pads were placed but not used for pacing  Patient has been reexamined.  Reexamined up until now and blood pressures been maintained but did have bradycardia.  Heart rate improving some now at around 230.  Patient also more awake.  States that he got too high.  States he was trying to do fentanyl.  States he is not sure if there was something in it.   Will continue to monitor.  Care turned over to Dr. Freida Busman.  Patient is now returned to baseline before care turned over to oncoming provider.  Up and ambulating.  States he wants to go to [facility].  Encouraged to help find sobriety.  Will discharge.         Final Clinical Impression(s) / ED Diagnoses Final diagnoses:  Accidental overdose, initial encounter    Rx / DC Orders ED Discharge Orders     None         Benjiman Core, MD 08/21/23 1511    Benjiman Core, MD 08/21/23 1526

## 2023-08-21 NOTE — ED Notes (Addendum)
Patient is A&O, NAD he was able to ambulate to restroom without complaints

## 2023-08-21 NOTE — ED Triage Notes (Signed)
Patient brought in by EMS from home for overdose. Per EMS patient was found unresponsive by family. Patient stated he shot up fentanyl today around 1030 am. EMS gave .5mg  of Narcan. Patient is brady 38-42bpm. Patient family states patient has abscess to L cheek due to infected tooth.  20g started in L hand by EMS 180/110 CBG:163 42 99% 20

## 2024-11-27 ENCOUNTER — Other Ambulatory Visit: Payer: Self-pay

## 2024-11-27 ENCOUNTER — Encounter (HOSPITAL_BASED_OUTPATIENT_CLINIC_OR_DEPARTMENT_OTHER): Payer: Self-pay | Admitting: Emergency Medicine

## 2024-11-27 ENCOUNTER — Emergency Department (HOSPITAL_BASED_OUTPATIENT_CLINIC_OR_DEPARTMENT_OTHER)
Admission: EM | Admit: 2024-11-27 | Discharge: 2024-11-27 | Disposition: A | Discharge status: Home / Self Care | Payer: MEDICAID | Attending: Emergency Medicine | Admitting: Emergency Medicine

## 2024-11-27 DIAGNOSIS — R220 Localized swelling, mass and lump, head: Secondary | ICD-10-CM | POA: Diagnosis present

## 2024-11-27 DIAGNOSIS — L723 Sebaceous cyst: Secondary | ICD-10-CM | POA: Diagnosis not present

## 2024-11-27 MED ORDER — LIDOCAINE-EPINEPHRINE (PF) 2 %-1:200000 IJ SOLN
10.0000 mL | Freq: Once | INTRAMUSCULAR | Status: AC
  Administered 2024-11-27: 10 mL via INTRADERMAL
  Filled 2024-11-27: qty 20

## 2024-11-27 NOTE — ED Notes (Signed)
 Provider aware of equipment/meds being placed in the room.

## 2024-11-27 NOTE — ED Provider Notes (Signed)
  EMERGENCY DEPARTMENT AT [facility] Provider Note   CSN: [id number] Arrival date & time: 11/27/24  1636     Patient presents with: Cyst   [name] is a 33 y.o. male.   33 yo M with a chief complaint of a bump to the left side of his face.  This has been there for years.  He had it drained at some point and said there was a lot of cheese in it.  He said he is ready to have it drained again.  He would like to have it removed if possible.  No fevers no injury.        Prior to Admission medications   Medication Sig Start Date End Date Taking? Authorizing Provider  acetaminophen  (TYLENOL ) 500 MG tablet Take 2 tablets (1,000 mg total) by mouth every 6 (six) hours. 11/15/21   Augustus Almarie RAMAN, PA-C  docusate sodium  (COLACE) 100 MG capsule Take 1 capsule (100 mg total) by mouth 2 (two) times daily. 11/15/21   Augustus Almarie RAMAN, PA-C  ferrous sulfate  325 (65 FE) MG tablet Take 1 tablet (325 mg total) by mouth 2 (two) times daily with a meal. 11/15/21 12/30/21  Simaan, Almarie RAMAN, PA-C  oxyCODONE  (OXY IR/ROXICODONE ) 5 MG immediate release tablet Take 1 tablet (5 mg total) by mouth every 6 (six) hours as needed for moderate pain or severe pain (severe pain not relieved by tylenol , MS Contin ). 11/15/21   Simaan, Almarie RAMAN, PA-C  polyethylene glycol powder (GLYCOLAX /MIRALAX ) 17 GM/SCOOP powder Take 17 g by mouth daily. 11/16/21   Augustus Almarie RAMAN, PA-C  rivaroxaban  (XARELTO ) 10 MG TABS tablet Take 1 tablet (10 mg total) by mouth daily. 11/15/21 12/15/21  Simaan, Elizabeth S, PA-C    Allergies: Cefzil [cefprozil]    Review of Systems  Updated Vital Signs BP (!) 127/94 (BP Location: Right Arm)   Pulse (!) 55   Temp 98.4 F (36.9 C)   Resp 18   Ht 5' 10 (1.778 m)   Wt 66.7 kg   SpO2 97%   BMI 21.09 kg/m   Physical Exam Vitals and nursing note reviewed.  Constitutional:      Appearance: He is well-developed.  HENT:     Head: Normocephalic and  atraumatic.     Comments: Just anterior to the ear the patient has a fairly large fluctuant area.  Skin colored.  No tenderness no induration. Eyes:     Pupils: Pupils are equal, round, and reactive to light.  Neck:     Vascular: No JVD.  Cardiovascular:     Rate and Rhythm: Normal rate and regular rhythm.     Heart sounds: No murmur heard.    No friction rub. No gallop.  Pulmonary:     Effort: No respiratory distress.     Breath sounds: No wheezing.  Abdominal:     General: There is no distension.     Tenderness: There is no abdominal tenderness. There is no guarding or rebound.  Musculoskeletal:        General: Normal range of motion.     Cervical back: Normal range of motion and neck supple.  Skin:    Coloration: Skin is not pale.     Findings: No rash.  Neurological:     Mental Status: He is alert and oriented to person, place, and time.  Psychiatric:        Behavior: Behavior normal.     (all labs ordered are listed, but only  abnormal results are displayed) Labs Reviewed - No data to display  EKG: None  Radiology: No results found.   .Incision and Drainage  Date/Time: 11/27/2024 6:21 PM  Performed by: Emil Share, DO Authorized by: Emil Share, DO   Consent:    Consent obtained:  Verbal   Consent given by:  Patient   Risks, benefits, and alternatives were discussed: yes     Risks discussed:  Bleeding, incomplete drainage and infection   Alternatives discussed:  No treatment, delayed treatment and alternative treatment Universal protocol:    Procedure explained and questions answered to patient or proxy's satisfaction: yes     Patient identity confirmed:  Verbally with patient Location:    Type:  Cyst   Location:  Head   Head location:  Face Pre-procedure details:    Skin preparation:  Chlorhexidine  Sedation:    Sedation type:  None Anesthesia:    Anesthesia method:  Local infiltration   Local anesthetic:  Lidocaine  2% WITH epi Procedure type:     Complexity:  Complex Procedure details:    Ultrasound guidance: no     Needle aspiration: no     Incision types:  Single straight   Incision depth:  Subcutaneous   Wound management:  Probed and deloculated   Drainage characteristics: sebacous material.   Drainage amount:  Copious   Packing materials:  None Post-procedure details:    Procedure completion:  Tolerated well, no immediate complications    Medications Ordered in the ED  lidocaine -EPINEPHrine  (XYLOCAINE  W/EPI) 2 %-1:200000 (PF) injection 10 mL (10 mLs Intradermal Given by Other 11/27/24 1821)                                    Medical Decision Making Risk Prescription drug management.   33 yo M with a chief complaints of cyst to the left side of his face.  He would like it removed.  I discussed that this is usually done through plastic surgery or dermatology clinic.  He has may possibly need drainage.  Discussed risks and benefits at bedside.  Decision made to have it drained.  Quite large amounts of sebaceous material was removed.  Will have him follow-up with his family doctor in clinic.  6:23 PM:  I have discussed the diagnosis/risks/treatment options with the patient.  Evaluation and diagnostic testing in the emergency department does not suggest an emergent condition requiring admission or immediate intervention beyond what has been performed at this time.  They will follow up with PCP. We also discussed returning to the ED immediately if new or worsening sx occur. We discussed the sx which are most concerning (e.g., sudden worsening pain, fever, inability to tolerate by mouth) that necessitate immediate return. Medications administered to the patient during their visit and any new prescriptions provided to the patient are listed below.  Medications given during this visit Medications  lidocaine -EPINEPHrine  (XYLOCAINE  W/EPI) 2 %-1:200000 (PF) injection 10 mL (10 mLs Intradermal Given by Other 11/27/24 1821)     The  patient appears reasonably screen and/or stabilized for discharge and I doubt any other medical condition or other [facility] requiring further screening, evaluation, or treatment in the ED at this time prior to discharge.       Final diagnoses:  Sebaceous cyst    ED Discharge Orders     None          Emil Share, DO 11/27/24 1823

## 2024-11-27 NOTE — ED Triage Notes (Signed)
 Pt via pov from home with cyst on his face. Pt states he has had it drained before (over 10 years ago) and decided to drain it himself. He reports that he decided against it; he just wants it gone. Pt a&o x 4; nad noted.

## 2024-11-27 NOTE — ED Notes (Signed)
 DC paperwork given and verbally understood.

## 2024-11-27 NOTE — Discharge Instructions (Signed)
 Warm compresses at least 4 times a day.  Please follow-up with your doctor in clinic.  Please return for redness fever.  As we discussed this can be completely resected or at least an attempt could be made usually this is done by dermatology or plastic surgery.  Please ask your family doctor about scheduling an appointment if this is something he would like to have done.
